# Patient Record
Sex: Female | Born: 1970 | Race: White | Hispanic: No | Marital: Married | Smoking: Never smoker
Health system: Southern US, Community
[De-identification: ages and names within clinical notes are randomized; demographics above are authoritative.]

## PROBLEM LIST (undated history)

## (undated) DIAGNOSIS — D649 Anemia, unspecified: Secondary | ICD-10-CM

## (undated) DIAGNOSIS — J302 Other seasonal allergic rhinitis: Secondary | ICD-10-CM

## (undated) HISTORY — PX: WISDOM TOOTH EXTRACTION: SHX21

## (undated) HISTORY — PX: APPENDECTOMY: SHX54

---

## 2017-02-27 ENCOUNTER — Other Ambulatory Visit: Payer: Self-pay | Admitting: Obstetrics & Gynecology

## 2017-02-27 DIAGNOSIS — R928 Other abnormal and inconclusive findings on diagnostic imaging of breast: Secondary | ICD-10-CM

## 2017-03-04 ENCOUNTER — Ambulatory Visit
Admission: RE | Admit: 2017-03-04 | Discharge: 2017-03-04 | Disposition: A | Discharge status: Home / Self Care | Payer: 59 | Source: Ambulatory Visit | Attending: Obstetrics & Gynecology | Admitting: Obstetrics & Gynecology

## 2017-03-04 ENCOUNTER — Encounter: Payer: Self-pay | Admitting: Radiology

## 2017-03-04 DIAGNOSIS — R928 Other abnormal and inconclusive findings on diagnostic imaging of breast: Secondary | ICD-10-CM

## 2017-03-11 ENCOUNTER — Encounter (HOSPITAL_COMMUNITY): Payer: Self-pay | Admitting: *Deleted

## 2017-03-19 NOTE — H&P (Signed)
Abigail CopasJennifer Ware is an 46 y.o. female with heavy menstrual bleeding.  She declines hormonal treatment as well as hysterectomy.    Pertinent Gynecological History: Menses: flow is excessive with use of 8 pads or tampons on heaviest days Bleeding: regular Contraception: tubal ligation DES exposure: unknown Blood transfusions: none Sexually transmitted diseases: no past history Previous GYN Procedures: none  Last mammogram: normal Date: 2018 Last pap: normal Date: 2017 OB History: G2, P2   Menstrual History: Menarche age: n/a Patient's last menstrual period was 03/04/2017 (exact date).    Past Medical History:  Diagnosis Date  . Anemia    history  . Seasonal allergies   . SVD (spontaneous vaginal delivery)    x 2    Past Surgical History:  Procedure Laterality Date  . APPENDECTOMY    . WISDOM TOOTH EXTRACTION      Family History  Problem Relation Age of Onset  . Breast cancer Mother     Social History:  reports that she has never smoked. She has never used smokeless tobacco. She reports that she drinks about 1.8 oz of alcohol per week . She reports that she does not use drugs.  Allergies:  Allergies  Allergen Reactions  . Sulfa Antibiotics Rash    As a child    No prescriptions prior to admission.    ROS  Height 5\' 6"  (1.676 m), weight 168 lb (76.2 kg), last menstrual period 03/04/2017. Physical Exam  Constitutional: She is oriented to person, place, and time. She appears well-developed and well-nourished.  GI: Soft. There is no rebound and no guarding.  Neurological: She is alert and oriented to person, place, and time.  Skin: Skin is warm and dry.  Psychiatric: She has a normal mood and affect. Her behavior is normal.    No results found for this or any previous visit (from the past 24 hour(s)).  No results found.  Assessment/Plan: 46yo G2P2 with heavy menstrual bleeding -H/S, D&C, endometrial ablation -Patient has been counseled re: risk of bleeding,  infection, scarring, and damage to surrounding structures.  She understands that since she has had BTL, she is at risk of developing post-ablation pain syndrome.  Additionally, the uterine cavity cannot be reliably accessed should she have irregular bleeding in the futures.  She is informed that up to 50% of patients receiving ablation will require hysterectomy.  All questions were answered and the patient wishes to proceed.  Keller Bounds 03/19/2017, 9:16 PM

## 2017-03-25 ENCOUNTER — Ambulatory Visit (HOSPITAL_COMMUNITY): Payer: 59 | Admitting: Registered Nurse

## 2017-03-25 ENCOUNTER — Ambulatory Visit (HOSPITAL_COMMUNITY)
Admission: RE | Admit: 2017-03-25 | Discharge: 2017-03-25 | Disposition: A | Discharge status: Home / Self Care | Payer: 59 | Source: Ambulatory Visit | Attending: Obstetrics & Gynecology | Admitting: Obstetrics & Gynecology

## 2017-03-25 ENCOUNTER — Encounter (HOSPITAL_COMMUNITY)
Admission: RE | Disposition: A | Discharge status: Home / Self Care | Payer: Self-pay | Source: Ambulatory Visit | Attending: Obstetrics & Gynecology

## 2017-03-25 ENCOUNTER — Encounter (HOSPITAL_COMMUNITY): Payer: Self-pay

## 2017-03-25 DIAGNOSIS — Z79899 Other long term (current) drug therapy: Secondary | ICD-10-CM | POA: Diagnosis not present

## 2017-03-25 DIAGNOSIS — N920 Excessive and frequent menstruation with regular cycle: Secondary | ICD-10-CM | POA: Insufficient documentation

## 2017-03-25 HISTORY — DX: Anemia, unspecified: D64.9

## 2017-03-25 HISTORY — DX: Other seasonal allergic rhinitis: J30.2

## 2017-03-25 HISTORY — PX: DILITATION & CURRETTAGE/HYSTROSCOPY WITH NOVASURE ABLATION: SHX5568

## 2017-03-25 LAB — TYPE AND SCREEN
ABO/RH(D): O POS
Antibody Screen: NEGATIVE

## 2017-03-25 LAB — CBC
HEMATOCRIT: 40.6 % (ref 36.0–46.0)
Hemoglobin: 14.4 g/dL (ref 12.0–15.0)
MCH: 32.6 pg (ref 26.0–34.0)
MCHC: 35.5 g/dL (ref 30.0–36.0)
MCV: 91.9 fL (ref 78.0–100.0)
Platelets: 242 10*3/uL (ref 150–400)
RBC: 4.42 MIL/uL (ref 3.87–5.11)
RDW: 13.3 % (ref 11.5–15.5)
WBC: 4.9 10*3/uL (ref 4.0–10.5)

## 2017-03-25 LAB — ABO/RH: ABO/RH(D): O POS

## 2017-03-25 LAB — PREGNANCY, URINE: Preg Test, Ur: NEGATIVE

## 2017-03-25 SURGERY — DILATATION & CURETTAGE/HYSTEROSCOPY WITH NOVASURE ABLATION
Anesthesia: General | Site: Vagina

## 2017-03-25 MED ORDER — OXYCODONE-ACETAMINOPHEN 5-325 MG PO TABS
1.0000 | ORAL_TABLET | ORAL | Status: DC | PRN
  Administered 2017-03-25: 1 via ORAL

## 2017-03-25 MED ORDER — KETOROLAC TROMETHAMINE 30 MG/ML IJ SOLN: 30.0000 mg | Freq: Once | INTRAMUSCULAR | Status: DC | PRN

## 2017-03-25 MED ORDER — ONDANSETRON HCL 4 MG/2ML IJ SOLN
INTRAMUSCULAR | Status: AC
  Filled 2017-03-25: qty 2

## 2017-03-25 MED ORDER — ACETAMINOPHEN 160 MG/5ML PO SOLN: 325.0000 mg | ORAL | Status: DC | PRN

## 2017-03-25 MED ORDER — SCOPOLAMINE 1 MG/3DAYS TD PT72
1.0000 | MEDICATED_PATCH | Freq: Once | TRANSDERMAL | Status: DC
  Administered 2017-03-25: 1.5 mg via TRANSDERMAL

## 2017-03-25 MED ORDER — OXYCODONE-ACETAMINOPHEN 5-325 MG PO TABS: 1.0000 | ORAL_TABLET | Freq: Four times a day (QID) | ORAL | 0 refills | Status: DC | PRN

## 2017-03-25 MED ORDER — LIDOCAINE HCL (CARDIAC) 20 MG/ML IV SOLN
INTRAVENOUS | Status: AC
  Filled 2017-03-25: qty 5

## 2017-03-25 MED ORDER — KETOROLAC TROMETHAMINE 30 MG/ML IJ SOLN
INTRAMUSCULAR | Status: AC
  Filled 2017-03-25: qty 1

## 2017-03-25 MED ORDER — MEPERIDINE HCL 25 MG/ML IJ SOLN: 6.2500 mg | INTRAMUSCULAR | Status: DC | PRN

## 2017-03-25 MED ORDER — ACETAMINOPHEN 325 MG PO TABS: 325.0000 mg | ORAL_TABLET | ORAL | Status: DC | PRN

## 2017-03-25 MED ORDER — PROPOFOL 10 MG/ML IV BOLUS
INTRAVENOUS | Status: AC
  Filled 2017-03-25: qty 20

## 2017-03-25 MED ORDER — LIDOCAINE 2% (20 MG/ML) 5 ML SYRINGE
INTRAMUSCULAR | Status: DC | PRN
  Administered 2017-03-25: 50 mg via INTRAVENOUS

## 2017-03-25 MED ORDER — DEXAMETHASONE SODIUM PHOSPHATE 10 MG/ML IJ SOLN
INTRAMUSCULAR | Status: DC | PRN
  Administered 2017-03-25: 10 mg via INTRAVENOUS

## 2017-03-25 MED ORDER — MIDAZOLAM HCL 2 MG/2ML IJ SOLN
INTRAMUSCULAR | Status: AC
  Filled 2017-03-25: qty 2

## 2017-03-25 MED ORDER — DEXAMETHASONE SODIUM PHOSPHATE 10 MG/ML IJ SOLN
INTRAMUSCULAR | Status: AC
  Filled 2017-03-25: qty 1

## 2017-03-25 MED ORDER — OXYCODONE-ACETAMINOPHEN 5-325 MG PO TABS
ORAL_TABLET | ORAL | Status: AC
  Filled 2017-03-25: qty 1

## 2017-03-25 MED ORDER — FENTANYL CITRATE (PF) 100 MCG/2ML IJ SOLN
INTRAMUSCULAR | Status: AC
  Filled 2017-03-25: qty 2

## 2017-03-25 MED ORDER — ONDANSETRON HCL 4 MG/2ML IJ SOLN
INTRAMUSCULAR | Status: DC | PRN
  Administered 2017-03-25: 4 mg via INTRAVENOUS

## 2017-03-25 MED ORDER — CEFAZOLIN SODIUM-DEXTROSE 2-4 GM/100ML-% IV SOLN
2.0000 g | INTRAVENOUS | Status: AC
  Administered 2017-03-25: 2 g via INTRAVENOUS

## 2017-03-25 MED ORDER — ONDANSETRON HCL 4 MG/2ML IJ SOLN: 4.0000 mg | Freq: Once | INTRAMUSCULAR | Status: DC | PRN

## 2017-03-25 MED ORDER — KETOROLAC TROMETHAMINE 30 MG/ML IJ SOLN
INTRAMUSCULAR | Status: DC | PRN
  Administered 2017-03-25: 30 mg via INTRAVENOUS

## 2017-03-25 MED ORDER — LIDOCAINE HCL 1 % IJ SOLN
INTRAMUSCULAR | Status: AC
  Filled 2017-03-25: qty 20

## 2017-03-25 MED ORDER — MIDAZOLAM HCL 5 MG/5ML IJ SOLN
INTRAMUSCULAR | Status: DC | PRN
  Administered 2017-03-25: 2 mg via INTRAVENOUS

## 2017-03-25 MED ORDER — FENTANYL CITRATE (PF) 100 MCG/2ML IJ SOLN: 25.0000 ug | INTRAMUSCULAR | Status: DC | PRN

## 2017-03-25 MED ORDER — PROPOFOL 10 MG/ML IV BOLUS
INTRAVENOUS | Status: DC | PRN
  Administered 2017-03-25: 200 mg via INTRAVENOUS

## 2017-03-25 MED ORDER — FENTANYL CITRATE (PF) 100 MCG/2ML IJ SOLN
INTRAMUSCULAR | Status: DC | PRN
Start: 2017-03-25 — End: 2017-03-25
  Administered 2017-03-25 (×2): 50 ug via INTRAVENOUS

## 2017-03-25 MED ORDER — LACTATED RINGERS IV SOLN
INTRAVENOUS | Status: DC
  Administered 2017-03-25: 125 mL/h via INTRAVENOUS

## 2017-03-25 MED ORDER — IBUPROFEN 800 MG PO TABS: 800.0000 mg | ORAL_TABLET | Freq: Four times a day (QID) | ORAL | 0 refills | Status: AC | PRN

## 2017-03-25 MED ORDER — SODIUM CHLORIDE 0.9 % IR SOLN
Status: DC | PRN
  Administered 2017-03-25: 3000 mL

## 2017-03-25 MED ORDER — SCOPOLAMINE 1 MG/3DAYS TD PT72
MEDICATED_PATCH | TRANSDERMAL | Status: AC
  Administered 2017-03-25: 1.5 mg via TRANSDERMAL
  Filled 2017-03-25: qty 1

## 2017-03-25 SURGICAL SUPPLY — 16 items
ABLATOR ENDOMETRIAL BIPOLAR (ABLATOR) ×3 IMPLANT
CANISTER SUCT 3000ML PPV (MISCELLANEOUS) ×3 IMPLANT
CATH ROBINSON RED A/P 16FR (CATHETERS) ×3 IMPLANT
CLOTH BEACON ORANGE TIMEOUT ST (SAFETY) ×3 IMPLANT
CONTAINER PREFILL 10% NBF 60ML (FORM) IMPLANT
GLOVE BIO SURGEON STRL SZ 6 (GLOVE) ×3 IMPLANT
GLOVE BIOGEL PI IND STRL 6 (GLOVE) ×2 IMPLANT
GLOVE BIOGEL PI IND STRL 7.0 (GLOVE) ×1 IMPLANT
GLOVE BIOGEL PI INDICATOR 6 (GLOVE) ×4
GLOVE BIOGEL PI INDICATOR 7.0 (GLOVE) ×2
GOWN STRL REUS W/TWL LRG LVL3 (GOWN DISPOSABLE) ×6 IMPLANT
PACK VAGINAL MINOR WOMEN LF (CUSTOM PROCEDURE TRAY) ×3 IMPLANT
PAD OB MATERNITY 4.3X12.25 (PERSONAL CARE ITEMS) ×3 IMPLANT
TOWEL OR 17X24 6PK STRL BLUE (TOWEL DISPOSABLE) ×6 IMPLANT
TUBING AQUILEX INFLOW (TUBING) ×3 IMPLANT
TUBING AQUILEX OUTFLOW (TUBING) ×3 IMPLANT

## 2017-03-25 NOTE — Progress Notes (Signed)
No change to H&P.  Abigail Repsher, DO 

## 2017-03-25 NOTE — Anesthesia Procedure Notes (Signed)
Procedure Name: LMA Insertion Date/Time: 03/25/2017 1:00 PM Performed by: Jhonnie Garner Pre-anesthesia Checklist: Patient identified, Emergency Drugs available, Suction available and Patient being monitored Patient Re-evaluated:Patient Re-evaluated prior to inductionOxygen Delivery Method: Circle system utilized Preoxygenation: Pre-oxygenation with 100% oxygen Intubation Type: IV induction Ventilation: Mask ventilation without difficulty LMA: LMA inserted LMA Size: 4.0 Number of attempts: 1 Placement Confirmation: positive ETCO2 and breath sounds checked- equal and bilateral Tube secured with: Tape Dental Injury: Teeth and Oropharynx as per pre-operative assessment

## 2017-03-25 NOTE — Transfer of Care (Signed)
Immediate Anesthesia Transfer of Care Note  Patient: Abigail Ware  Procedure(s) Performed: Procedure(s): DILATATION & CURETTAGE/HYSTEROSCOPY WITH NOVASURE ABLATION (N/A)  Patient Location: PACU  Anesthesia Type:General  Level of Consciousness:  sedated, patient cooperative and responds to stimulation  Airway & Oxygen Therapy:Patient Spontanous Breathing and Patient connected to face mask oxgen  Post-op Assessment:  Report given to PACU RN and Post -op Vital signs reviewed and stable  Post vital signs:  Reviewed and stable  Last Vitals:  Vitals:   03/25/17 1206  BP: 130/79  Pulse: 79  Resp: 16  Temp: 37.2 C    Complications: No apparent anesthesia complications

## 2017-03-25 NOTE — Op Note (Signed)
PREOPERATIVE DIAGNOSIS:  46 y.o. with heavy menstrual bleeding  POSTOPERATIVE DIAGNOSIS: The same  PROCEDURE: Hysteroscopy, Dilation and Curettage, NovaSure.  SURGEON:  Dr. Mitchel Honour  INDICATIONS: 46 y.o. here for scheduled surgery for treatment of heavy menstrual bleeding.   Risks of surgery were discussed with the patient including but not limited to: bleeding which may require transfusion; infection which may require antibiotics; injury to uterus or surrounding organs; intrauterine scarring which may impair future fertility; need for additional procedures including laparotomy or laparoscopy; and other postoperative/anesthesia complications. Written informed consent was obtained.    FINDINGS:  An 8 week size uterus.  Diffuse proliferative endometrium.  Normal ostia bilaterally.  ANESTHESIA:   General  ESTIMATED BLOOD LOSS:  Less than 20 ml  SPECIMENS: Endometrial curettings sent to pathology  COMPLICATIONS:  None immediate.  PROCEDURE DETAILS:  The patient received intravenous antibiotics while in the preoperative area.  She was then taken to the operating room where general anesthesia was administered and was found to be adequate.  After an adequate timeout was performed, she was placed in the dorsal lithotomy position and examined; then prepped and draped in the sterile manner.   Her bladder was catheterized for an unmeasured amount of clear, yellow urine. A speculum was then placed in the patient's vagina and a single tooth tenaculum was applied to the anterior lip of the cervix.   The uteus was sounded to 10 cm and dilated manually with metal dilators to accommodate the 5.5 mm diagnostic hysteroscope.  Once the cervix was dilated, the hysteroscope was inserted under direct visualization using saline as a suspension medium.  The uterine cavity was carefully examined, both ostia were recognized.   After further careful visualization of the uterine cavity, the hysteroscope was removed under  direct visualization; measurement of cervical length of 4.5 cm was noted on removal.  A sharp curettage was then performed to obtain a moderate amount of endometrial curettings.  The NovaSure was opened and placed in the cavity per manufacturer's protocol.  The device was seated and cavity width found to be 4.4 cm.  The cavity assessment was performed and then the treatment cycle which lasted 1 minute 35 seconds.  The device was removed.  The tenaculum was removed from the anterior lip of the cervix and the vaginal speculum was removed after noting good hemostasis.  The patient tolerated the procedure well and was taken to the recovery area awake, extubated and in stable condition.

## 2017-03-25 NOTE — Anesthesia Preprocedure Evaluation (Addendum)
Anesthesia Evaluation  Patient identified by MRN, date of birth, ID band Patient awake    Reviewed: Allergy & Precautions, H&P , NPO status , Patient's Chart, lab work & pertinent test results  Airway Mallampati: I  TM Distance: >3 FB Neck ROM: full    Dental no notable dental hx. (+) Teeth Intact   Pulmonary neg pulmonary ROS,    Pulmonary exam normal        Cardiovascular negative cardio ROS Normal cardiovascular exam     Neuro/Psych negative neurological ROS  negative psych ROS   GI/Hepatic negative GI ROS, Neg liver ROS,   Endo/Other  negative endocrine ROS  Renal/GU negative Renal ROS     Musculoskeletal negative musculoskeletal ROS (+)   Abdominal Normal abdominal exam  (+)   Peds  Hematology   Anesthesia Other Findings   Reproductive/Obstetrics negative OB ROS                            Anesthesia Physical Anesthesia Plan  ASA: I  Anesthesia Plan: General   Post-op Pain Management:    Induction: Intravenous  Airway Management Planned: LMA  Additional Equipment:   Intra-op Plan:   Post-operative Plan:   Informed Consent: I have reviewed the patients History and Physical, chart, labs and discussed the procedure including the risks, benefits and alternatives for the proposed anesthesia with the patient or authorized representative who has indicated his/her understanding and acceptance.     Plan Discussed with: CRNA and Surgeon  Anesthesia Plan Comments:         Anesthesia Quick Evaluation

## 2017-03-25 NOTE — Discharge Instructions (Signed)
Call MD for T>100.4, heavy vaginal bleeding, severe abdominal pain, intractable nausea and/or vomiting, or respiratory distress.  Call office to schedule postop appointment in 2 weeks.  No driving while taking narcotics.  Pelvic rest x 4 weeks.    DISCHARGE INSTRUCTIONS: HYSTEROSCOPY / ENDOMETRIAL ABLATION The following instructions have been prepared to help you care for yourself upon your return home.  May Remove Scop patch on or before 03/28/17  May take Ibuprofen after 7:20 pm today.  May take stool softner while taking narcotic pain medication to prevent constipation.  Drink plenty of water.  Personal hygiene:  Use sanitary pads for vaginal drainage, not tampons.  Shower the day after your procedure.  NO tub baths, pools or Jacuzzis for 2-3 weeks.  Wipe front to back after using the bathroom.  Activity and limitations:  Do NOT drive or operate any equipment for 24 hours. The effects of anesthesia are still present and drowsiness may result.  Do NOT rest in bed all day.  Walking is encouraged.  Walk up and down stairs slowly.  You may resume your normal activity in one to two days or as indicated by your physician. Sexual activity: NO intercourse for at least 2 weeks after the procedure, or as indicated by your Doctor.  Diet: Eat a light meal as desired this evening. You may resume your usual diet tomorrow.  Return to Work: You may resume your work activities in one to two days or as indicated by Therapist, sports.  What to expect after your surgery: Expect to have vaginal bleeding/discharge for 2-3 days and spotting for up to 10 days. It is not unusual to have soreness for up to 1-2 weeks. You may have a slight burning sensation when you urinate for the first day. Mild cramps may continue for a couple of days. You may have a regular period in 2-6 weeks.  Call your doctor for any of the following:  Excessive vaginal bleeding or clotting, saturating and changing one pad  every hour.  Inability to urinate 6 hours after discharge from hospital.  Pain not relieved by pain medication.  Fever of 100.4 F or greater.  Unusual vaginal discharge or odor.  Return to office _________________Call for an appointment ___________________ Patients signature: ______________________ Nurses signature ________________________  Post Anesthesia Care Unit [phone]     Post Anesthesia Home Care Instructions  Activity: Get plenty of rest for the remainder of the day. A responsible individual must stay with you for 24 hours following the procedure.  For the next 24 hours, DO NOT: -Drive a car -Advertising copywriter -Drink alcoholic beverages -Take any medication unless instructed by your physician -Make any legal decisions or sign important papers.  Meals: Start with liquid foods such as gelatin or soup. Progress to regular foods as tolerated. Avoid greasy, spicy, heavy foods. If nausea and/or vomiting occur, drink only clear liquids until the nausea and/or vomiting subsides. Call your physician if vomiting continues.  Special Instructions/Symptoms: Your throat may feel dry or sore from the anesthesia or the breathing tube placed in your throat during surgery. If this causes discomfort, gargle with warm salt water. The discomfort should disappear within 24 hours.  If you had a scopolamine patch placed behind your ear for the management of post- operative nausea and/or vomiting:  1. The medication in the patch is effective for 72 hours, after which it should be removed.  Wrap patch in a tissue and discard in the trash. Wash hands thoroughly with soap and water.  2. You may remove the patch earlier than 72 hours if you experience unpleasant side effects which may include dry mouth, dizziness or visual disturbances. °3. Avoid touching the patch. Wash your hands with soap and water after contact with the patch. °  ° °

## 2017-03-25 NOTE — Anesthesia Postprocedure Evaluation (Addendum)
Anesthesia Post Note  Patient: Abigail Ware  Procedure(s) Performed: Procedure(s) (LRB): DILATATION & CURETTAGE/HYSTEROSCOPY WITH NOVASURE ABLATION (N/A)  Patient location during evaluation: PACU Anesthesia Type: General Level of consciousness: awake Pain management: pain level controlled Vital Signs Assessment: post-procedure vital signs reviewed and stable Respiratory status: spontaneous breathing Cardiovascular status: stable Postop Assessment: no signs of nausea or vomiting Anesthetic complications: no        Last Vitals:  Vitals:   03/25/17 1206 03/25/17 1334  BP: 130/79 110/67  Pulse: 79 78  Resp: 16 16  Temp: 37.2 C 37.1 C    Last Pain:  Vitals:   03/25/17 1206  TempSrc: Oral   Pain Goal: Patients Stated Pain Goal: 4 (03/25/17 1206)               Veleka Djordjevic JR,JOHN Susann Givens

## 2017-03-26 ENCOUNTER — Encounter (HOSPITAL_COMMUNITY): Payer: Self-pay | Admitting: Obstetrics & Gynecology

## 2017-05-29 NOTE — Addendum Note (Signed)
Addendum  created 05/29/17 1137 by Antwonette Feliz, MD   Sign clinical note    

## 2017-07-09 ENCOUNTER — Ambulatory Visit (HOSPITAL_COMMUNITY)
Admission: EM | Admit: 2017-07-09 | Discharge: 2017-07-09 | Disposition: A | Discharge status: Home / Self Care | Payer: 59 | Attending: Family Medicine | Admitting: Family Medicine

## 2017-07-09 ENCOUNTER — Encounter (HOSPITAL_COMMUNITY): Payer: Self-pay

## 2017-07-09 DIAGNOSIS — N390 Urinary tract infection, site not specified: Secondary | ICD-10-CM

## 2017-07-09 DIAGNOSIS — R319 Hematuria, unspecified: Secondary | ICD-10-CM

## 2017-07-09 LAB — POCT URINALYSIS DIP (DEVICE)
Bilirubin Urine: NEGATIVE
Glucose, UA: NEGATIVE mg/dL
Ketones, ur: NEGATIVE mg/dL
NITRITE: NEGATIVE
PH: 6 (ref 5.0–8.0)
Protein, ur: 100 mg/dL — AB
Specific Gravity, Urine: >=1.03 (ref 1.005–1.030)
UROBILINOGEN UA: 0.2 mg/dL (ref 0.0–1.0)

## 2017-07-09 MED ORDER — CEPHALEXIN 500 MG PO CAPS: 500.0000 mg | ORAL_CAPSULE | Freq: Two times a day (BID) | ORAL | 0 refills | Status: DC

## 2017-07-09 NOTE — ED Triage Notes (Signed)
Pt noticed on Monday she has a uti. Frequency, dysuria and now having hematuria. No fever or otc meds tried.

## 2017-07-10 NOTE — ED Provider Notes (Signed)
  [facility]    ASSESSMENT & PLAN:  Today you were diagnosed with the following: 1. Urinary tract infection with hematuria, site unspecified     You have been prescribed prescription medications this visit.   Meds ordered this encounter  Medications  . cephALEXin (KEFLEX) 500 MG capsule    Sig: Take 1 capsule (500 mg total) by mouth 2 (two) times daily.    Dispense:  14 capsule    Refill:  0    We have send a urine culture and will contact you when the results are available.  You may use over the counter Pyridium if needed for any discomfort associated with urination. Please do your best to ensure adequate fluid intake.  If you are not improving over the next few days or feel you are worsening please follow up here or the Emergency Department if you are unable to see your regular doctor.  Outlined signs and symptoms indicating need for more acute intervention. Call or return to clinic if these symptoms worsen or fail to improve as anticipated. Patient verbalized understanding. After Visit Summary given.  SUBJECTIVE:  Abigail Ware is a 46 y.o. female who complaints of urinary frequency, urgency and dysuria for the past 3 days, without flank pain, fever, chills, abnormal vaginal discharge or bleeding. + hematuria. Normal PO intake. No flank or abdominal pain. No self treatment.  LMP: BP 111/61 (BP Location: Left Arm)   Pulse 60   Temp 98.8 F (37.1 C) (Oral)   Resp 18   LMP 06/25/2017 (Exact Date)   OBJECTIVE:  Vitals:   07/09/17 1720  BP: 111/61  Pulse: 60  Resp: 18  Temp: 98.8 F (37.1 C)  TempSrc: Oral  SpO2: 100%    Appears well, in no apparent distress. Abdomen is soft without tenderness, guarding, mass, rebound or organomegaly. No CVA tenderness or inguinal adenopathy noted.  Labs Reviewed  POCT URINALYSIS DIP (DEVICE) - Abnormal; Notable for the following:       Result Value   Hgb urine dipstick LARGE (*)    Protein, ur 100 (*)    Leukocytes, UA LARGE (*)    All other components within normal limits  URINE CULTURE        Mardella LaymanHagler, Valerio Pinard, MD 07/10/17 1309

## 2017-07-12 LAB — URINE CULTURE: Culture: 80000 — AB

## 2021-03-20 ENCOUNTER — Other Ambulatory Visit: Payer: Self-pay | Admitting: Family

## 2021-03-20 DIAGNOSIS — R9389 Abnormal findings on diagnostic imaging of other specified body structures: Secondary | ICD-10-CM

## 2021-03-21 ENCOUNTER — Other Ambulatory Visit: Payer: Self-pay | Admitting: Family

## 2021-03-25 ENCOUNTER — Ambulatory Visit
Admission: RE | Admit: 2021-03-25 | Discharge: 2021-03-25 | Disposition: A | Discharge status: Home / Self Care | Payer: 59 | Source: Ambulatory Visit | Attending: Family | Admitting: Family

## 2021-03-25 ENCOUNTER — Other Ambulatory Visit: Payer: Self-pay

## 2021-03-25 ENCOUNTER — Other Ambulatory Visit: Payer: Self-pay | Admitting: General Practice

## 2021-03-25 DIAGNOSIS — R9389 Abnormal findings on diagnostic imaging of other specified body structures: Secondary | ICD-10-CM

## 2021-03-25 MED ORDER — GADOBUTROL 1 MMOL/ML IV SOLN
7.0000 mL | Freq: Once | INTRAVENOUS | Status: AC | PRN
  Administered 2021-03-25: 7 mL via INTRAVENOUS

## 2021-05-22 HISTORY — PX: BREAST LUMPECTOMY: SHX2

## 2021-12-30 ENCOUNTER — Encounter: Payer: Self-pay | Admitting: Pulmonary Disease

## 2021-12-30 ENCOUNTER — Other Ambulatory Visit: Payer: Self-pay

## 2021-12-30 ENCOUNTER — Ambulatory Visit (INDEPENDENT_AMBULATORY_CARE_PROVIDER_SITE_OTHER): Payer: 59 | Admitting: Pulmonary Disease

## 2021-12-30 VITALS — BP 112/72 | HR 70 | Temp 98.9°F | Ht 66.0 in | Wt 163.8 lb

## 2021-12-30 DIAGNOSIS — G4719 Other hypersomnia: Secondary | ICD-10-CM | POA: Diagnosis not present

## 2021-12-30 NOTE — Addendum Note (Signed)
Addended by: Arvilla Market on: 12/30/2021 04:09 PM   Modules accepted: Orders

## 2021-12-30 NOTE — Addendum Note (Signed)
Addended by: Arvilla Market on: 12/30/2021 04:13 PM   Modules accepted: Orders

## 2021-12-30 NOTE — Progress Notes (Signed)
Abigail Ware    [id number]    Apr 08, 1971  Primary Care Physician:Pcp, No  Referring Physician: No referring provider defined for this encounter.  Chief complaint:   Patient with daytime fatigue  HPI:  Nonrestorative sleep Sore throat in the mornings Bruxism  Choking episodes, wake up with a panic  Usually goes to bed about 11 PM, final wake up time about 6:30 AM Falls asleep easily Number of awakenings vary significantly  No significant weight gain  Previous sleep study about 8 years ago-was told very mild did not require intervention at the time  More symptoms been reported lately  Gets very fatigued in the late afternoons  Never smoker   Outpatient Encounter Medications as of 12/30/2021  Medication Sig   b complex vitamins tablet Take 1 tablet by mouth daily.   ibuprofen (ADVIL,MOTRIN) 800 MG tablet Take 1 tablet (800 mg total) by mouth every 6 (six) hours as needed.   MAGNESIUM PO Take 1 tablet by mouth daily.   Multiple Vitamin (MULTIVITAMIN WITH MINERALS) TABS tablet Take 1 tablet by mouth daily.   [DISCONTINUED] cephALEXin (KEFLEX) 500 MG capsule Take 1 capsule (500 mg total) by mouth 2 (two) times daily.   [DISCONTINUED] oxyCODONE-acetaminophen (ROXICET) 5-325 MG tablet Take 1-2 tablets by mouth every 6 (six) hours as needed for severe pain.   [DISCONTINUED] phentermine 37.5 MG capsule Take 37.5 mg by mouth every morning.   No facility-administered encounter medications on file as of 12/30/2021.    Allergies as of 12/30/2021 - Review Complete 12/30/2021  Allergen Reaction Noted   Codeine Rash 03/25/2017   Sulfa antibiotics Rash 03/11/2017    Past Medical History:  Diagnosis Date   Anemia    history   Seasonal allergies    SVD (spontaneous vaginal delivery)    x 2    Past Surgical History:  Procedure Laterality Date   APPENDECTOMY     BREAST LUMPECTOMY  05/2021   DILITATION & CURRETTAGE/HYSTROSCOPY WITH NOVASURE ABLATION N/A 03/25/2017    Procedure: DILATATION & CURETTAGE/HYSTEROSCOPY WITH NOVASURE ABLATION;  Surgeon: Mitchel Honour, DO;  Location: [facility];  Service: Gynecology;  Laterality: N/A;   WISDOM TOOTH EXTRACTION      Family History  Problem Relation Age of Onset   Breast cancer Mother    COPD Neg Hx    Lupus Neg Hx     Social History   Socioeconomic History   Marital status: Married    Spouse name: Not on file   Number of children: Not on file   Years of education: Not on file   Highest education level: Not on file  Occupational History   Not on file  Tobacco Use   Smoking status: Never   Smokeless tobacco: Never  Substance and Sexual Activity   Alcohol use: Yes    Alcohol/week: 3.0 standard drinks    Types: 3 Glasses of wine per week    Comment: wine    Drug use: No   Sexual activity: Not on file    Comment: husband - vasectomy  Other Topics Concern   Not on file  Social History Narrative   Not on file   Social Determinants of Health   Financial Resource Strain: Not on file  Food Insecurity: Not on file  Transportation Needs: Not on file  Physical Activity: Not on file  Stress: Not on file  Social Connections: Not on file  Intimate Partner Violence: Not on file    Review  of Systems  Constitutional:  Positive for fatigue.  Respiratory:  Negative for shortness of breath.   Psychiatric/Behavioral:  Positive for sleep disturbance.    Vitals:   12/30/21 1533  BP: 112/72  Pulse: 70  Temp: 98.9 F (37.2 C)  SpO2: 95%     Physical Exam Constitutional:      Appearance: Normal appearance.  HENT:     Head: Normocephalic.     Mouth/Throat:     Mouth: Mucous membranes are moist.  Cardiovascular:     Rate and Rhythm: Normal rate and regular rhythm.     Heart sounds: No murmur heard.   No friction rub.  Pulmonary:     Effort: No respiratory distress.     Breath sounds: No stridor. No wheezing or rhonchi.  Musculoskeletal:     Cervical back: No rigidity or tenderness.   Neurological:     Mental Status: She is alert.  Psychiatric:        Mood and Affect: Mood normal.   Results of the Epworth flowsheet 12/30/2021  Sitting and reading 2  Watching TV 2  Sitting, inactive in a public place (e.g. a theatre or a meeting) 1  As a passenger in a car for an hour without a break 3  Lying down to rest in the afternoon when circumstances permit 3  Sitting and talking to someone 0  Sitting quietly after a lunch without alcohol 3  In a car, while stopped for a few minutes in traffic 0  Total score 14     Data Reviewed: Previous study not available but by patient report was mild  Assessment:  Excessive daytime sleepiness  Daytime fatigue  History of bruxism  Moderate probability of significant obstructive sleep apnea  Pathophysiology of sleep disordered breathing discussed Treatment options for sleep disordered breathing discussed  Plan/Recommendations: Schedule patient for an in lab sleep study  In lab study more appropriate with a history of nonrestorative sleep, bruxism  Trial with melatonin-10 mg at night  Follow-up with 3 to 4 months  She will be more inclined towards an oral device rather than CPAP therapy for sleep apnea if found to be positive   Virl Diamond MD [facility] 12/30/2021, 4:02 PM  CC: No ref. provider found

## 2021-12-30 NOTE — Patient Instructions (Signed)
We will schedule you for an in lab sleep study  -History of mild obstructive sleep apnea -Significant symptoms suggesting sleep apnea -History of bruxism  Tentative follow-up in 3 to 4 months  Melatonin may help-try about 10 mg nightly  Call with significant concerns

## 2022-01-27 DIAGNOSIS — G4710 Hypersomnia, unspecified: Secondary | ICD-10-CM | POA: Diagnosis not present

## 2022-01-29 ENCOUNTER — Ambulatory Visit (HOSPITAL_BASED_OUTPATIENT_CLINIC_OR_DEPARTMENT_OTHER): Payer: 59 | Attending: Pulmonary Disease | Admitting: Pulmonary Disease

## 2022-01-29 ENCOUNTER — Other Ambulatory Visit: Payer: Self-pay

## 2022-01-29 DIAGNOSIS — G4719 Other hypersomnia: Secondary | ICD-10-CM | POA: Insufficient documentation

## 2022-01-29 DIAGNOSIS — G4733 Obstructive sleep apnea (adult) (pediatric): Secondary | ICD-10-CM | POA: Insufficient documentation

## 2022-01-29 DIAGNOSIS — R0683 Snoring: Secondary | ICD-10-CM | POA: Diagnosis not present

## 2022-02-04 ENCOUNTER — Telehealth: Payer: Self-pay | Admitting: Pulmonary Disease

## 2022-02-04 NOTE — Telephone Encounter (Signed)
Call patient  Sleep study result  Date of study: 01/29/2022  Impression: Excessive daytime sleepiness Study was negative for significant sleep apnea  Recommendation: Follow-up as previously scheduled  Optimize sleep hygiene, try and obtain at least 6 to 8 hours of sleep nightly  Follow-up for further discussions regarding options of treatment

## 2022-02-04 NOTE — Procedures (Signed)
POLYSOMNOGRAPHY  Last, First: Abigail, Ware MRN: [id number] Gender: Female Age (years): 50 Weight (lbs): 160 DOB: [birth year] BMI: 26 Primary Care: No PCP Epworth Score: 16 Referring: Tomma Lightning MD Technician: Lowry Ram Interpreting: Tomma Lightning MD Study Type: NPSG Ordered Study Type: NPSG Study date: 01/29/2022 Location: [place] CLINICAL INFORMATION Abigail Ware is a 51 year old Female and was referred to the sleep center for evaluation of G47.80 Other Sleep Disorders. Indications include Daytime Fatigue, Snoring.  MEDICATIONS Patient self administered medications include: N/A. Medications administered during study include No sleep medicine administered.  SLEEP STUDY TECHNIQUE A multi-channel overnight Polysomnography study was performed. The channels recorded and monitored were central and occipital EEG, electrooculogram (EOG), submentalis EMG (chin), nasal and oral airflow, thoracic and abdominal wall motion, anterior tibialis EMG, snore microphone, electrocardiogram, and a pulse oximetry. TECHNICIAN COMMENTS Comments added by Technician: None Comments added by Scorer: N/A SLEEP ARCHITECTURE The study was initiated at 9:59:40 PM and terminated at 5:02:04 AM. The total recorded time was 422.4 minutes. EEG confirmed total sleep time was 401.5 minutes yielding a sleep efficiency of 95.1%%. Sleep onset after lights out was 4.3 minutes with a REM latency of 48.5 minutes. The patient spent 2.6%% of the night in stage N1 sleep, 54.8%% in stage N2 sleep, 0.0%% in stage N3 and 42.6% in REM. Wake after sleep onset (WASO) was 16.6 minutes. The Arousal Index was 20.2/hour. RESPIRATORY PARAMETERS There were a total of 13 respiratory disturbances out of which 0 were apneas ( 0 obstructive, 0 mixed, 0 central) and 13 hypopneas. The apnea/hypopnea index (AHI) was 1.9 events/hour. The central sleep apnea index was 0 events/hour. The REM AHI was 3.5 events/hour and NREM AHI  was 0.8 events/hour. The supine AHI was 1.8 events/hour and the non supine AHI was 2.1 supine during 64.67% of sleep. Respiratory disturbances were associated with oxygen desaturation down to a nadir of 89.0% during sleep. The mean oxygen saturation during the study was 94.6%. The cumulative time under 88% oxygen saturation was 5.5 minutes.  LEG MOVEMENT DATA The total leg movements were 105 with a resulting leg movement index of 15.7/hr .Associated arousal with leg movement index was 2.8/hr.  CARDIAC DATA The underlying cardiac rhythm was most consistent with sinus rhythm. Mean heart rate during sleep was 65.6 bpm. Additional rhythm abnormalities include None.  IMPRESSIONS - No Significant Obstructive Sleep apnea(OSA) - EKG showed no cardiac abnormalities. - No significant Oxygen Desaturation - The patient snored with moderate snoring volume. - No significant periodic leg movements(PLMs) during sleep. However, no significant associated arousals.  DIAGNOSIS - Negative study for significant sleep disordered breathing. - Excessive daytime sleepiness.  RECOMMENDATIONS - Avoid alcohol, sedatives and other CNS depressants that may worsen sleep apnea and disrupt normal sleep architecture. - Sleep hygiene should be reviewed to assess factors that may improve sleep quality. - Weight management and regular exercise should be initiated or continued.  [Electronically signed] 02/04/2022 06:33 AM  Virl Diamond MD NPI: [id number]

## 2022-02-05 NOTE — Telephone Encounter (Signed)
Patient is returning phone call. Patient phone number is [phone]. Available after 5:00 pm.

## 2022-02-05 NOTE — Telephone Encounter (Signed)
I called and left a message for this patient to call back.

## 2022-02-06 NOTE — Telephone Encounter (Signed)
Called and spoke with patient regarding sleep study results. She wanted a follow up to discuss results about sleep study because she is having issues with sleep. Got her scheduled with Dr Val Eagle 03/20/22 at 4:15pm.  Nothing further needed

## 2022-03-20 ENCOUNTER — Ambulatory Visit: Payer: 59 | Admitting: Pulmonary Disease

## 2022-03-24 ENCOUNTER — Other Ambulatory Visit: Payer: Self-pay | Admitting: Surgery

## 2022-03-24 DIAGNOSIS — R9389 Abnormal findings on diagnostic imaging of other specified body structures: Secondary | ICD-10-CM

## 2022-04-07 ENCOUNTER — Ambulatory Visit: Payer: 59 | Admitting: Pulmonary Disease

## 2022-04-07 ENCOUNTER — Encounter: Payer: Self-pay | Admitting: Pulmonary Disease

## 2022-04-07 VITALS — BP 108/70 | HR 75 | Temp 98.4°F | Ht 66.0 in | Wt 164.0 lb

## 2022-04-07 DIAGNOSIS — G4719 Other hypersomnia: Secondary | ICD-10-CM

## 2022-04-07 NOTE — Patient Instructions (Signed)
Your sleep study is negative for significant sleep disordered breathing, your oxygen stayed normal ? ?It is possible to have nonrestorative sleep form significant snoring ? ?An oral device may be an option for treatment of snoring ? ?With a completely negative study with excellent sleep efficiency and the fact that you had a study a while back that was also negative ?-It is unlikely that a repeat study will be any different ?-It is an option when one has significant symptoms and the study is negative ? ?-Surgical evaluation for surgical options is also reasonable ? ?I will see you as needed ?

## 2022-04-07 NOTE — Progress Notes (Signed)
? ?      ?Abigail Ware    [id number]    1971/03/13 ? ?Primary Care Physician:Pcp, No ? ?Referring Physician: No referring provider defined for this encounter. ? ?Chief complaint:   ?Patient with daytime fatigue ? ?HPI: ? 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 ?Choking episodes, wake up with a panic-this is continuing ? ?Usually goes to bed about 11 PM, final wake up time about 6:30 AM ?Falls asleep easily ?Number of awakenings vary significantly ? ?No significant weight gain ? ?Previous sleep study about 8 years ago-was told very mild did not require intervention at the time ? ?She remains concerned about her symptoms but with a significantly negative sleep study with good sleep efficiency it is unlikely that significant sleep apnea exists ? ?Gets very fatigued in the late afternoons ? ?Never smoker ? ?Outpatient Encounter Medications as of 04/07/2022  ?Medication Sig  ? b complex vitamins tablet Take 1 tablet by mouth daily.  ? ibuprofen (ADVIL,MOTRIN) 800 MG tablet Take 1 tablet (800 mg total) by mouth every 6 (six) hours as needed.  ? MAGNESIUM PO Take 1 tablet by mouth daily.  ? Multiple Vitamin (MULTIVITAMIN WITH MINERALS) TABS tablet Take 1 tablet by mouth daily.  ? [DISCONTINUED] diazepam (VALIUM) 5 MG tablet Take by mouth. (Patient not taking: Reported on 04/07/2022)  ? [DISCONTINUED] predniSONE (DELTASONE) 50 MG tablet Take 1 tablet by mouth 13 hours before scan, then take 1 tablet by mouth 7 hours before scan, then take 1 tablet by mouth 1 hour before scan (with benadryl) (Patient not taking: Reported on 04/07/2022)  ? ?No facility-administered encounter medications on file as of 04/07/2022.  ? ? ?Allergies as of  04/07/2022 - Review Complete 04/07/2022  ?Allergen Reaction Noted  ? Codeine Rash 03/25/2017  ? Gadolinium Itching 03/24/2022  ? Sulfa antibiotics Rash 03/11/2017  ? ? ?Past Medical History:  ?Diagnosis Date  ? Anemia   ? history  ? Seasonal allergies   ? SVD (spontaneous vaginal delivery)   ? x 2  ? ? ?Past Surgical History:  ?Procedure Laterality Date  ? APPENDECTOMY    ? BREAST LUMPECTOMY  05/2021  ? DILITATION & CURRETTAGE/HYSTROSCOPY WITH NOVASURE ABLATION N/A 03/25/2017  ? Procedure: DILATATION & CURETTAGE/HYSTEROSCOPY WITH NOVASURE ABLATION;  Surgeon: Mitchel Honour, DO;  Location: [facility];  Service: Gynecology;  Laterality: N/A;  ? WISDOM TOOTH EXTRACTION    ? ? ?Family History  ?Problem Relation Age of Onset  ? Breast cancer Mother   ? COPD Neg Hx   ? Lupus Neg Hx   ? ? ?Social History  ? ?Socioeconomic History  ? Marital status: Married  ?  Spouse name: Not on file  ? Number of children: Not on file  ? Years of education: Not on file  ? Highest education level: Not on file  ?Occupational History  ? Not on file  ?Tobacco Use  ? Smoking status: Never  ? Smokeless tobacco: Never  ?Substance and Sexual Activity  ? Alcohol use: Yes  ?  Alcohol/week: 3.0 standard drinks  ?  Types: 3 Glasses of wine per week  ?  Comment: wine   ? Drug use: No  ? Sexual activity: Not on file  ?  Comment:  husband - vasectomy  ?Other Topics Concern  ? Not on file  ?Social History Narrative  ? Not on file  ? ?Social Determinants of Health  ? ?Financial Resource Strain: Not on file  ?Food Insecurity: Not on file  ?Transportation Needs: Not on file  ?Physical Activity: Not on file  ?Stress: Not on file  ?Social Connections: Not on file  ?Intimate Partner Violence: Not on file  ? ? ?Review of Systems  ?Constitutional:  Positive for fatigue.  ?Respiratory:  Negative for shortness of breath.   ?Psychiatric/Behavioral:  Positive for sleep disturbance.   ? ?Vitals:  ? 04/07/22 1611  ?BP: 108/70  ?Pulse: 75  ?Temp: 98.4 ?F (36.9 ?C)  ?SpO2: 97%   ? ? ? ?Physical Exam ?Constitutional:   ?   Appearance: Normal appearance.  ?HENT:  ?   Head: Normocephalic.  ?   Mouth/Throat:  ?   Mouth: Mucous membranes are moist.  ?Cardiovascular:  ?   Rate and Rhythm: Normal rate and regular rhythm.  ?   Heart sounds: No murmur heard. ?  No friction rub.  ?Pulmonary:  ?   Effort: No respiratory distress.  ?   Breath sounds: No stridor. No wheezing or rhonchi.  ?Musculoskeletal:  ?   Cervical back: No rigidity or tenderness.  ?Neurological:  ?   Mental Status: She is alert.  ?Psychiatric:     ?   Mood and Affect: Mood normal.  ? ? ?  12/30/2021  ?  3:00 PM  ?Results of the Epworth flowsheet  ?Sitting and reading 2  ?Watching TV 2  ?Sitting, inactive in a public place (e.g. a theatre or a meeting) 1  ?As a passenger in a car for an hour without a break 3  ?Lying down to rest in the afternoon when circumstances permit 3  ?Sitting and talking to someone 0  ?Sitting quietly after a lunch without alcohol 3  ?In a car, while stopped for a few minutes in traffic 0  ?Total score 14  ? ? ?Data Reviewed: ?Sleep study showing negative study for significant obstructive sleep apnea ? ?Assessment:  ?Excessive daytime sleepiness ? ?She does have significant snoring which may be contributing to nonrestorative sleep ? ?Daytime fatigue ? ?History of bruxism ? ?Pathophysiology of sleep disordered breathing discussed ?Treatment options for sleep disordered breathing discussed ? ?Does not have any symptoms suggesting presence of narcolepsy ? ?Plan/Recommendations: ?Patient should be evaluated for an oral device for the treatment of snoring ? ?I did offer a repeat sleep study however with an AHI of 1.9 with very good sleep efficiency is unlikely that significant sleep apnea exists.   ? ?She does have nonrestorative sleep which may be related to significant snoring ? ?Surgical interventions for airway narrowing may also be considered ? ?I will see as needed ? ?Virl Diamond MD ?[facility] ?04/07/2022, 4:36 PM ? ?CC: No ref. provider found ? ? ?

## 2022-04-11 ENCOUNTER — Ambulatory Visit
Admission: RE | Admit: 2022-04-11 | Discharge: 2022-04-11 | Disposition: A | Discharge status: Home / Self Care | Payer: 59 | Source: Ambulatory Visit | Attending: Surgery | Admitting: Surgery

## 2022-04-11 ENCOUNTER — Other Ambulatory Visit (HOSPITAL_COMMUNITY): Payer: Self-pay | Admitting: Diagnostic Radiology

## 2022-04-11 DIAGNOSIS — R9389 Abnormal findings on diagnostic imaging of other specified body structures: Secondary | ICD-10-CM

## 2022-04-11 MED ORDER — GADOBUTROL 1 MMOL/ML IV SOLN
7.0000 mL | Freq: Once | INTRAVENOUS | Status: AC | PRN
  Administered 2022-04-11: 7 mL via INTRAVENOUS

## 2022-06-16 ENCOUNTER — Ambulatory Visit
Admission: EM | Admit: 2022-06-16 | Discharge: 2022-06-16 | Disposition: A | Discharge status: Home / Self Care | Payer: 59 | Attending: Family Medicine | Admitting: Family Medicine

## 2022-06-16 DIAGNOSIS — N3090 Cystitis, unspecified without hematuria: Secondary | ICD-10-CM | POA: Insufficient documentation

## 2022-06-16 LAB — POCT URINALYSIS DIP (MANUAL ENTRY)
Bilirubin, UA: NEGATIVE
Glucose, UA: NEGATIVE mg/dL
Ketones, POC UA: NEGATIVE mg/dL
Nitrite, UA: NEGATIVE
Protein Ur, POC: NEGATIVE mg/dL
Spec Grav, UA: 1.01 (ref 1.010–1.025)
Urobilinogen, UA: 0.2 E.U./dL
pH, UA: 6 (ref 5.0–8.0)

## 2022-06-16 MED ORDER — CIPROFLOXACIN HCL 500 MG PO TABS: 500.0000 mg | ORAL_TABLET | Freq: Two times a day (BID) | ORAL | 0 refills | Status: AC

## 2022-06-17 ENCOUNTER — Telehealth: Payer: Self-pay | Admitting: Urgent Care

## 2022-06-17 ENCOUNTER — Telehealth: Payer: Self-pay

## 2022-06-17 MED ORDER — FLUCONAZOLE 150 MG PO TABS: 150.0000 mg | ORAL_TABLET | ORAL | 0 refills | Status: AC

## 2022-06-17 NOTE — Telephone Encounter (Signed)
Patient verification complete (name and date of birth).  Patient called and made aware that diflucan was sent to pharmacy [facility]) and frequency. All questions answered.

## 2022-06-19 LAB — URINE CULTURE: Culture: >=100000 — AB

## 2023-03-09 IMAGING — MG MM BREAST LOCALIZATION CLIP
4 series · 4 of 12 positions shown · non-contrast
Comparison: Previous exam(s).

CLINICAL DATA: Confirmation of clip placement after MRI guided core
needle biopsy of 2 sites in the RIGHT breast and 1 site in the LEFT
breast.

EXAM:
2D and 3D DIAGNOSTIC BILATERAL MAMMOGRAM POST MRI BIOPSY

[L CC synth-2D]
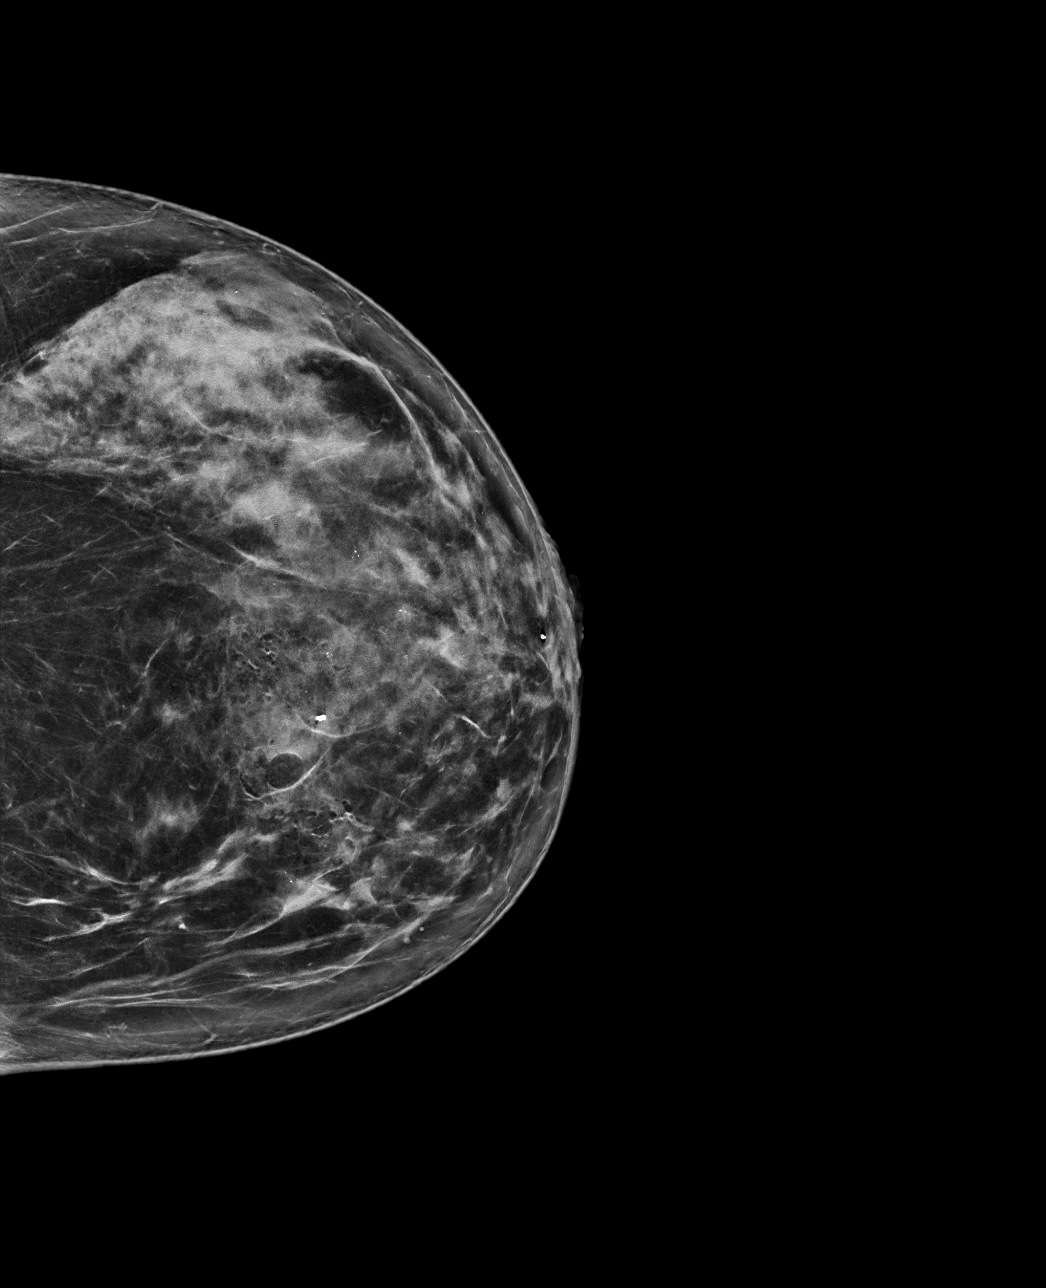

[L ML synth-2D]
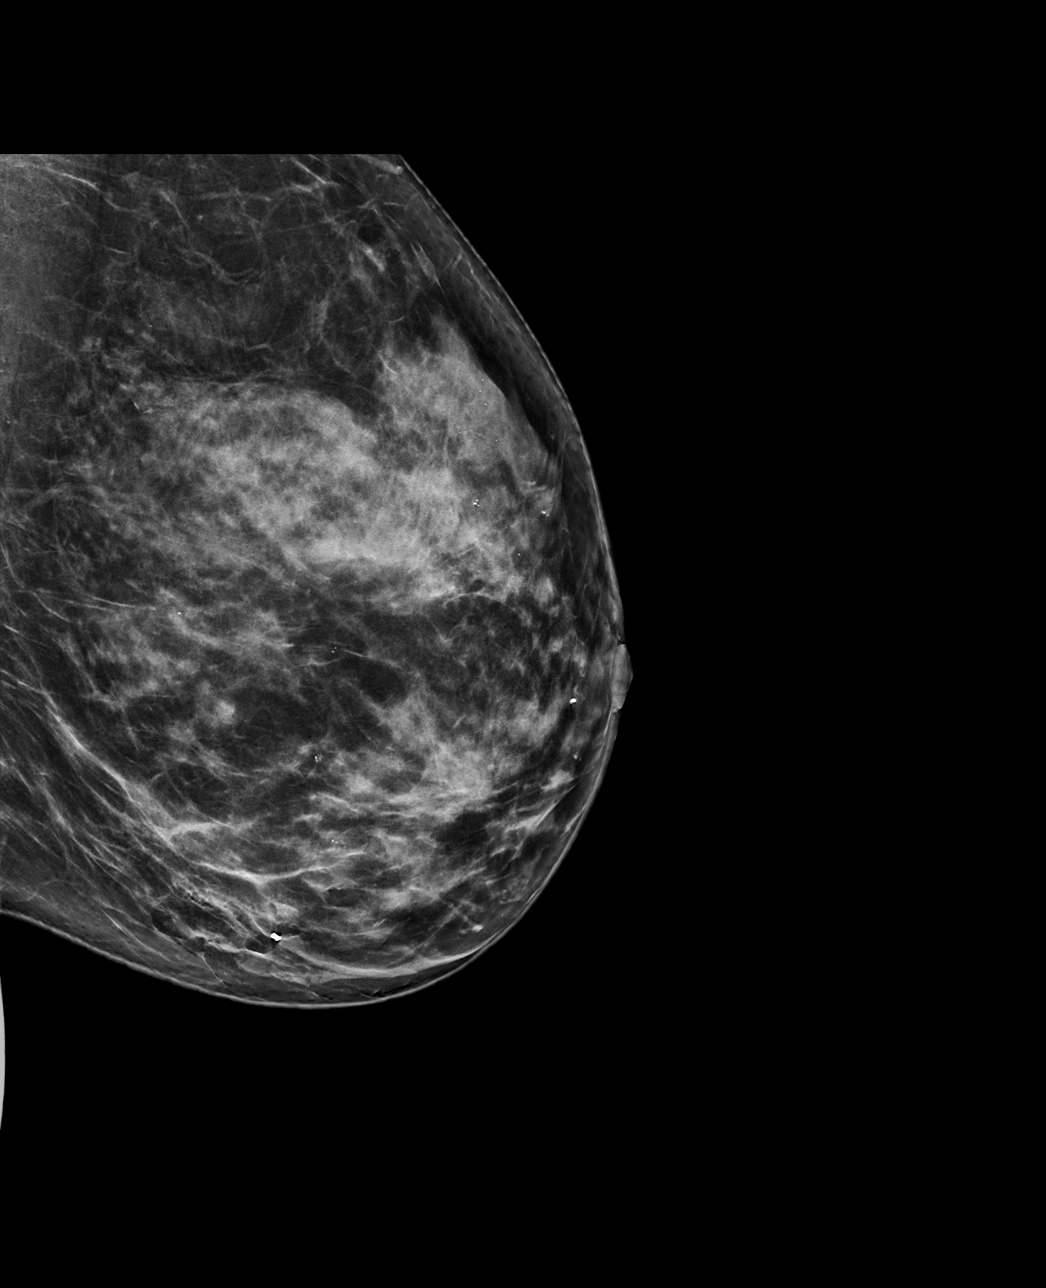

[L ML tomo · tomo slice 37/74.0]
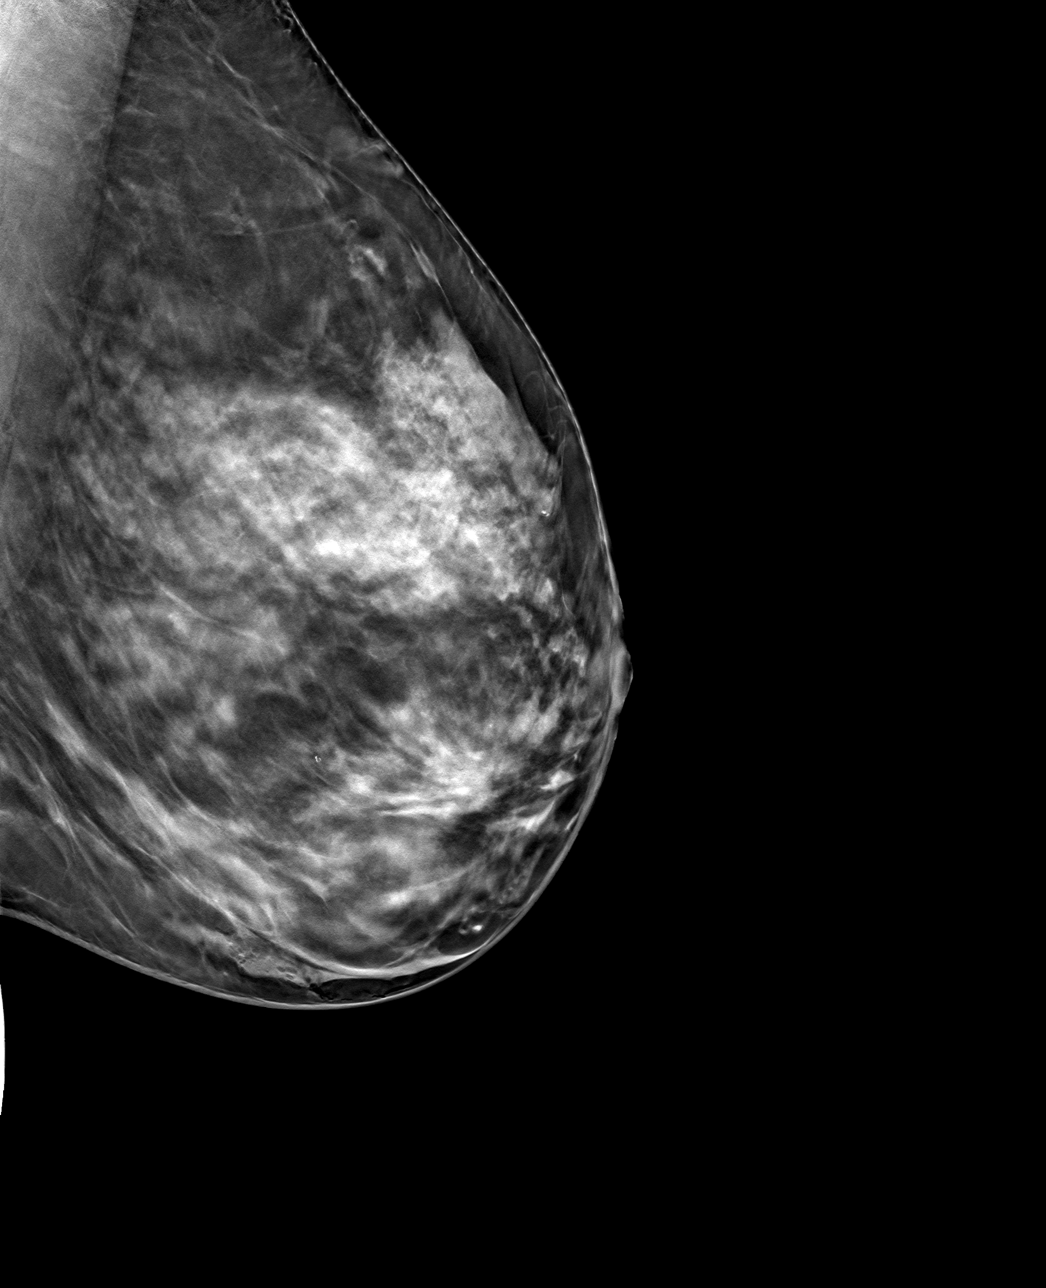

[L CC tomo · tomo slice 40/79.0]
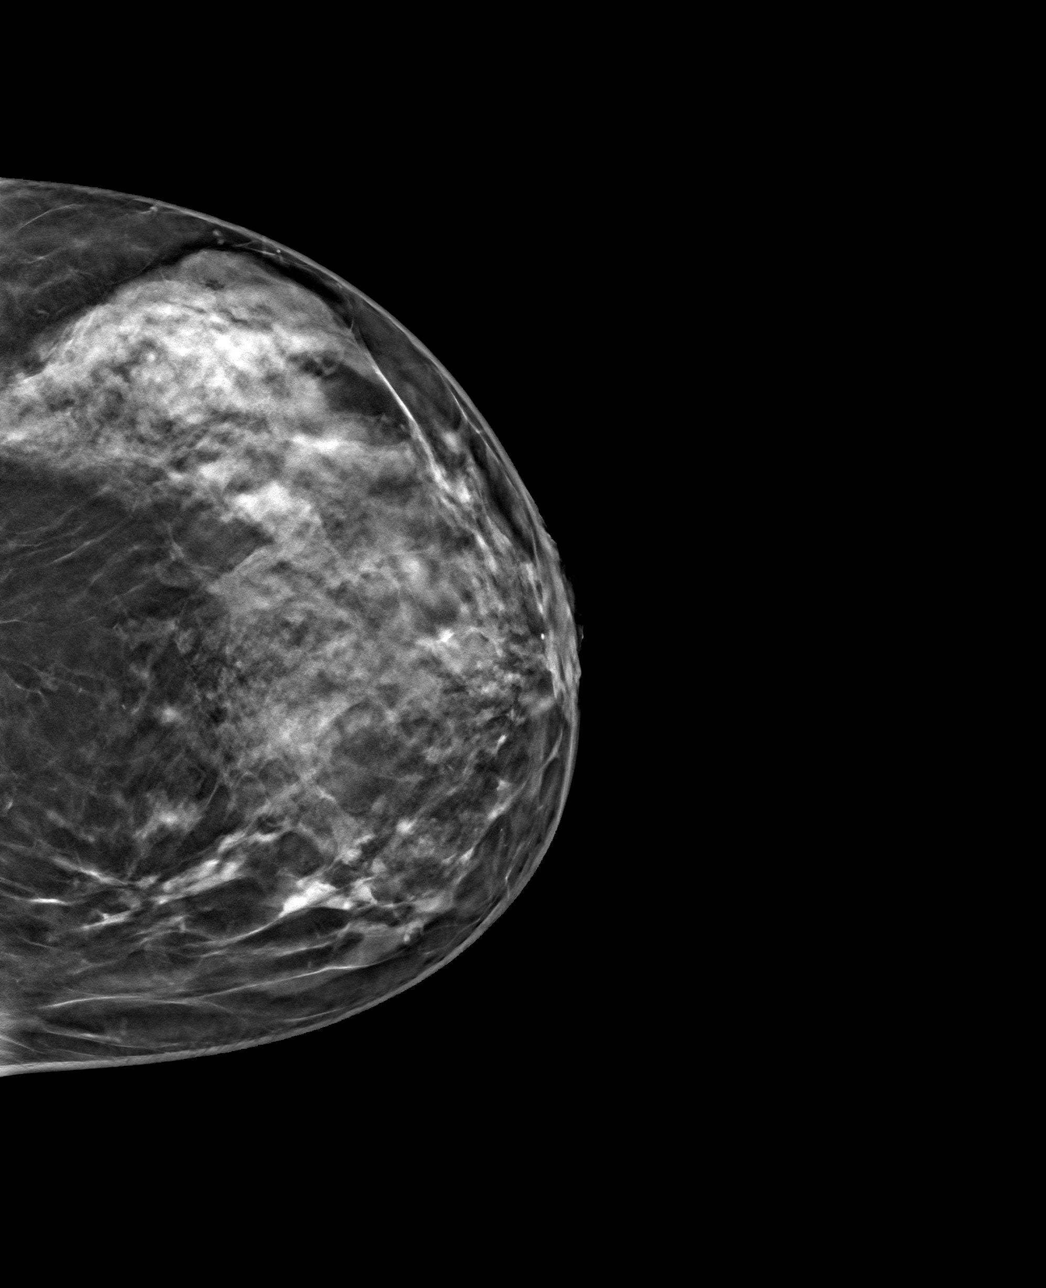

[4 of 12 positions shown; findings below may reference images not displayed]

FINDINGS: 2D and 3D full field CC and mediolateral images were obtained
following MRI guided biopsy of non-mass enhancement in the UPPER
INNER QUADRANT of the RIGHT breast at posterior depth, a mass in the
outer RIGHT breast at anterior depth, and a mass in the LOWER INNER
QUADRANT of the LEFT breast.

RIGHT: The barbell shaped tissue marking clip is appropriately
positioned at the site of the biopsied non-mass enhancement in the
UPPER INNER QUADRANT at posterior depth. The cylinder shaped tissue
marking clip is appropriately positioned at the site of the biopsied
mass in the UPPER INNER QUADRANT at anterior depth.

LEFT: The barbell shaped tissue marking clip is appropriately
positioned at the site of the biopsied mass in the LOWER INNER
QUADRANT at middle depth.

Expected post biopsy changes are present at all 3 sites without
evidence of hematoma.
IMPRESSION: 1. Appropriate positioning of the barbell shaped tissue marking clip
at the site of biopsy in the UPPER INNER QUADRANT of the RIGHT
breast at posterior depth.
2. Appropriate positioning of the cylinder shaped tissue marking
clip at the site of biopsy in the outer RIGHT breast at anterior
depth.
3. Appropriate positioning of the barbell shaped tissue marking clip
at the site of biopsy in the LOWER INNER QUADRANT of the LEFT breast
at middle depth.

Final Assessment: Post Procedure Mammograms for Marker Placement

## 2023-03-09 IMAGING — MG MM BREAST LOCALIZATION CLIP
4 series · 4 of 12 positions shown · non-contrast
Comparison: Previous exam(s).

CLINICAL DATA: Confirmation of clip placement after MRI guided core
needle biopsy of 2 sites in the RIGHT breast and 1 site in the LEFT
breast.

EXAM:
2D and 3D DIAGNOSTIC BILATERAL MAMMOGRAM POST MRI BIOPSY

[R CC synth-2D]
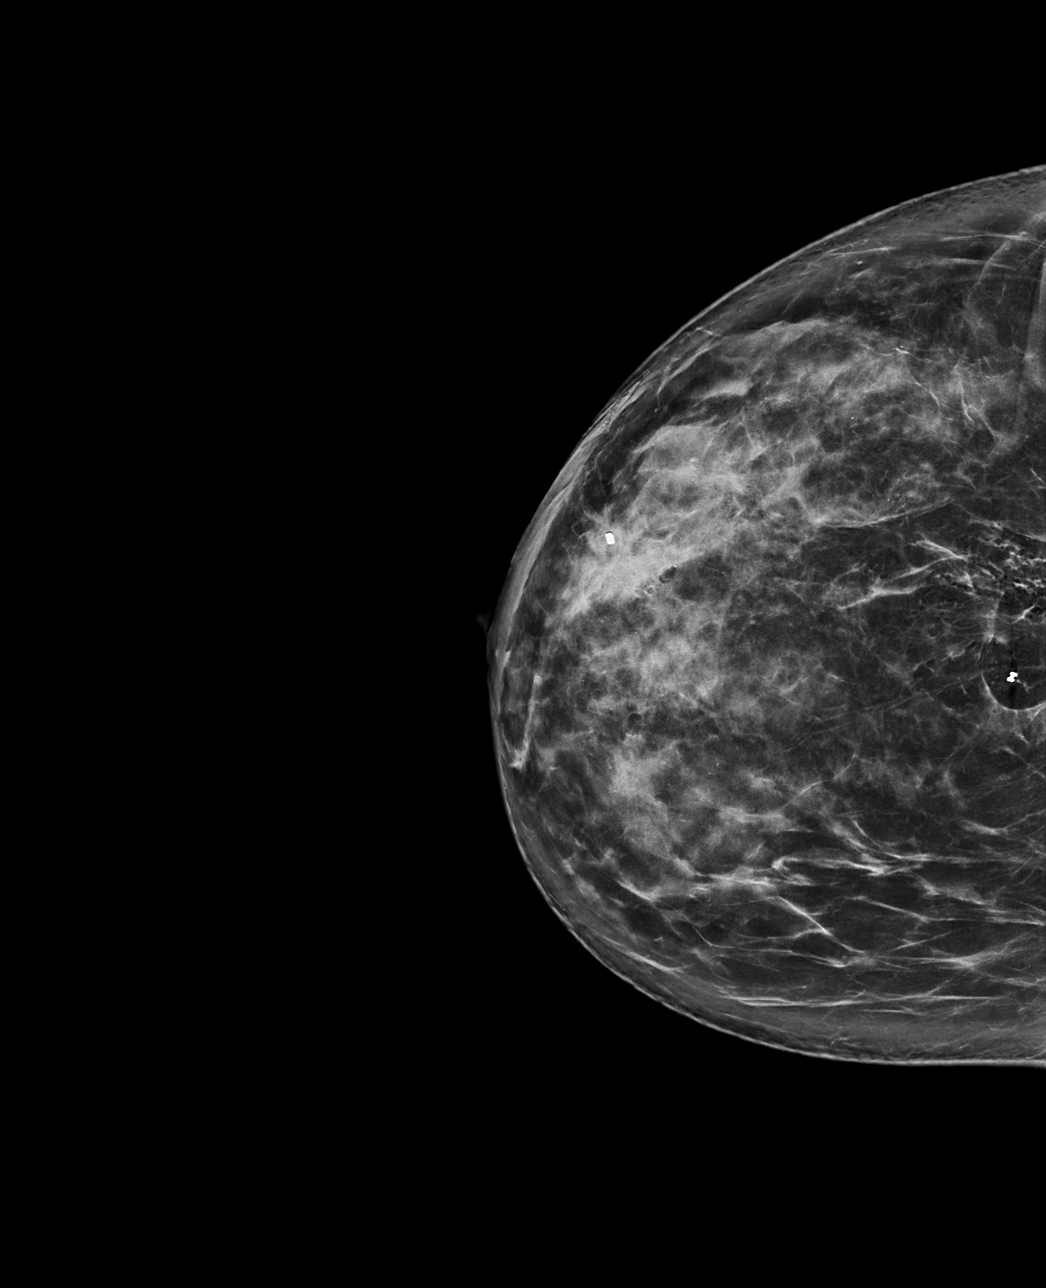

[R ML synth-2D]
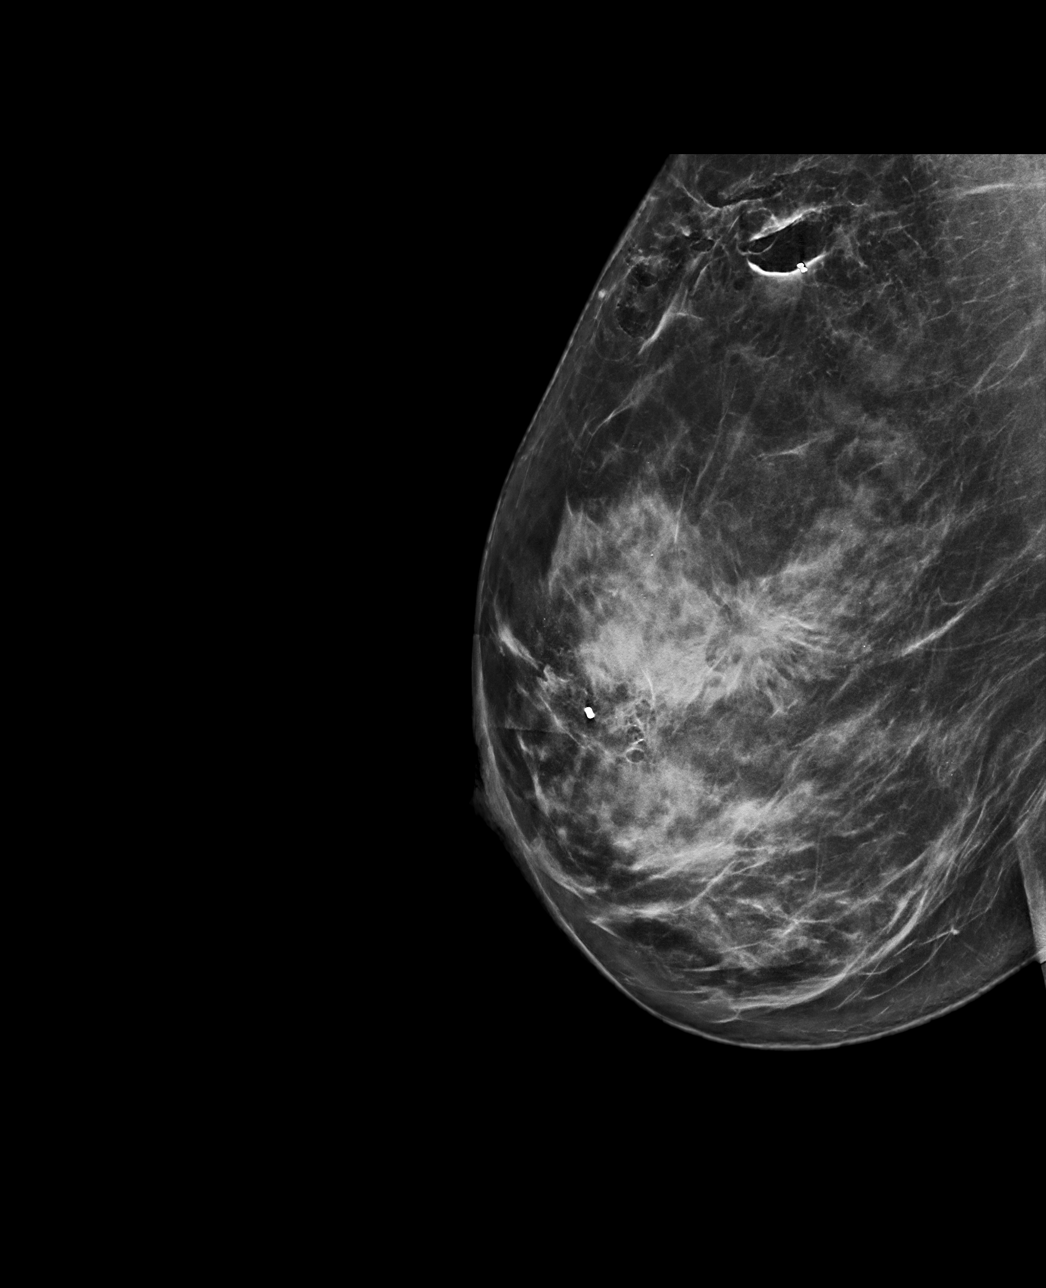

[R ML tomo · tomo slice 42/83.0]
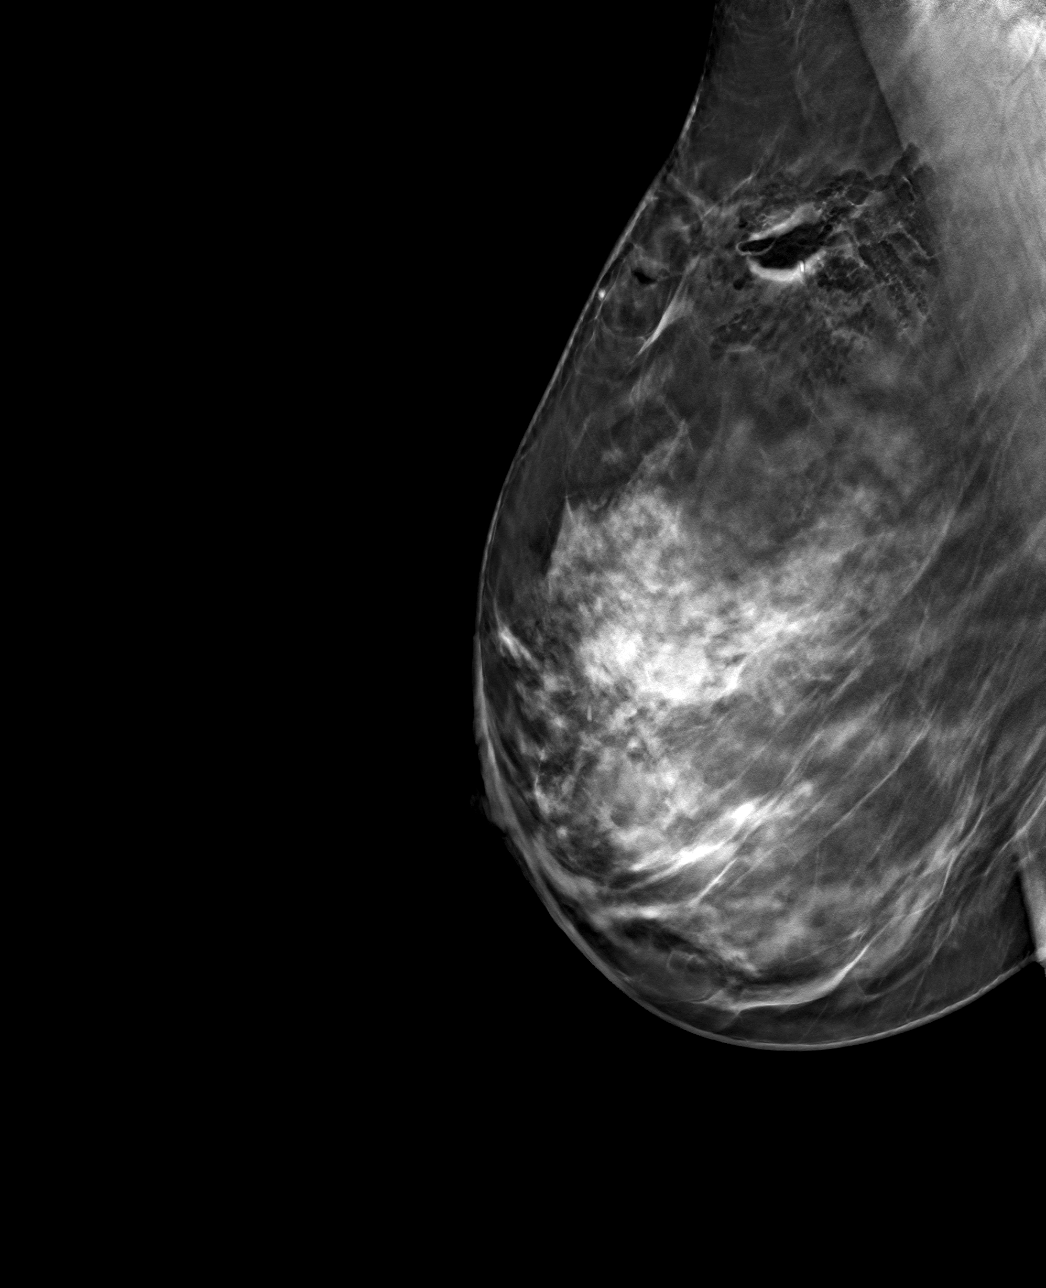

[R CC tomo · tomo slice 41/80.0]
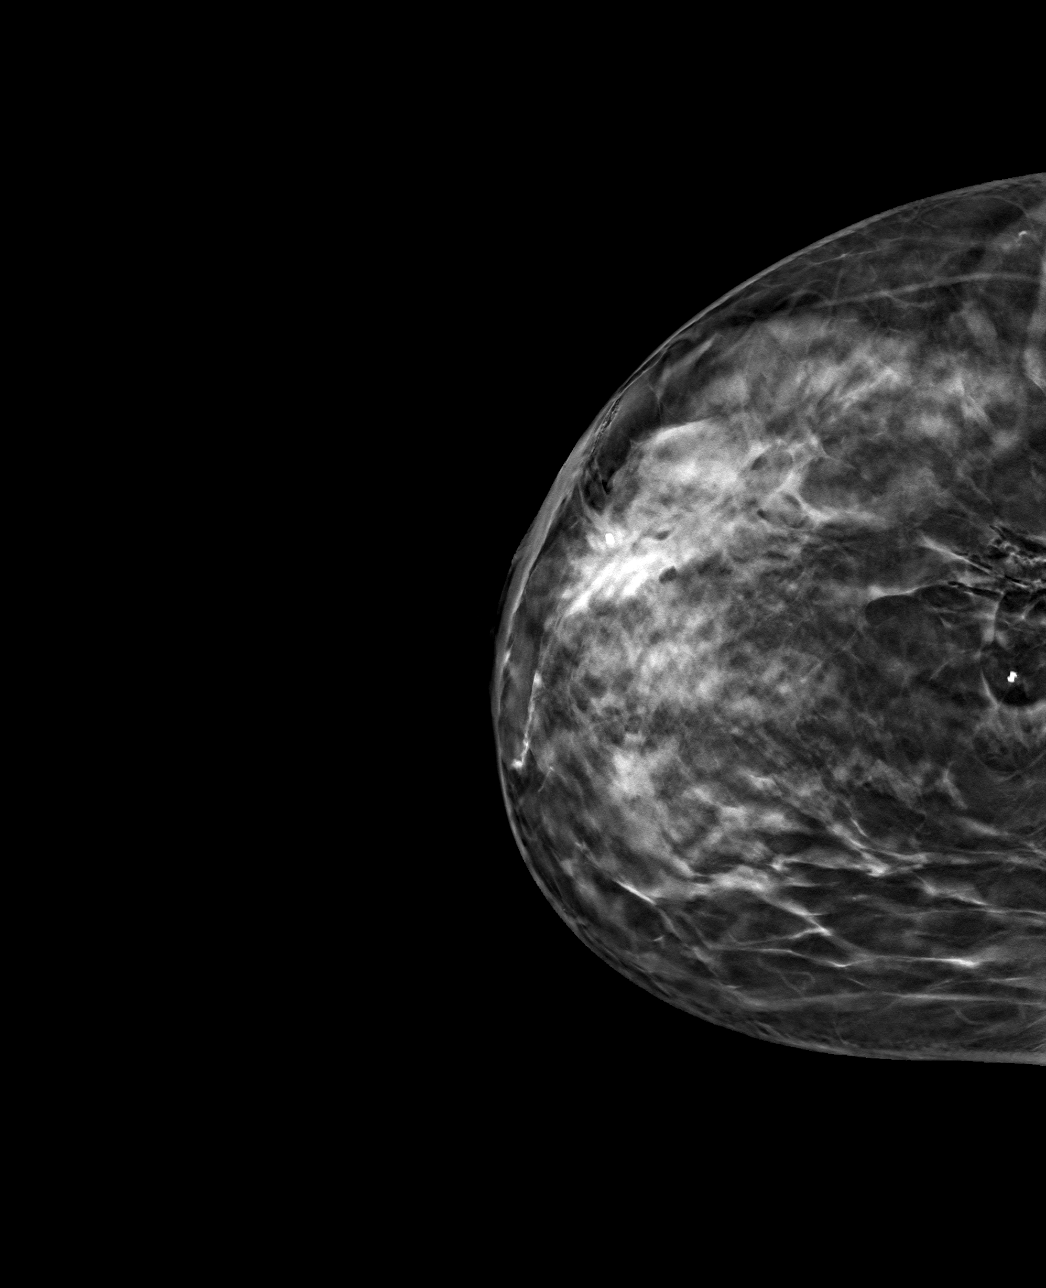

[4 of 12 positions shown; findings below may reference images not displayed]

FINDINGS: 2D and 3D full field CC and mediolateral images were obtained
following MRI guided biopsy of non-mass enhancement in the UPPER
INNER QUADRANT of the RIGHT breast at posterior depth, a mass in the
outer RIGHT breast at anterior depth, and a mass in the LOWER INNER
QUADRANT of the LEFT breast.

RIGHT: The barbell shaped tissue marking clip is appropriately
positioned at the site of the biopsied non-mass enhancement in the
UPPER INNER QUADRANT at posterior depth. The cylinder shaped tissue
marking clip is appropriately positioned at the site of the biopsied
mass in the UPPER INNER QUADRANT at anterior depth.

LEFT: The barbell shaped tissue marking clip is appropriately
positioned at the site of the biopsied mass in the LOWER INNER
QUADRANT at middle depth.

Expected post biopsy changes are present at all 3 sites without
evidence of hematoma.
IMPRESSION: 1. Appropriate positioning of the barbell shaped tissue marking clip
at the site of biopsy in the UPPER INNER QUADRANT of the RIGHT
breast at posterior depth.
2. Appropriate positioning of the cylinder shaped tissue marking
clip at the site of biopsy in the outer RIGHT breast at anterior
depth.
3. Appropriate positioning of the barbell shaped tissue marking clip
at the site of biopsy in the LOWER INNER QUADRANT of the LEFT breast
at middle depth.

Final Assessment: Post Procedure Mammograms for Marker Placement

## 2024-03-04 ENCOUNTER — Ambulatory Visit: Payer: 59 | Admitting: Obstetrics and Gynecology

## 2024-03-04 ENCOUNTER — Encounter: Payer: Self-pay | Admitting: Obstetrics and Gynecology

## 2024-03-04 VITALS — BP 110/72 | HR 98 | Ht 63.5 in | Wt 162.6 lb

## 2024-03-04 DIAGNOSIS — Z87440 Personal history of urinary (tract) infections: Secondary | ICD-10-CM

## 2024-03-04 DIAGNOSIS — K5904 Chronic idiopathic constipation: Secondary | ICD-10-CM | POA: Diagnosis not present

## 2024-03-04 DIAGNOSIS — R350 Frequency of micturition: Secondary | ICD-10-CM | POA: Diagnosis not present

## 2024-03-04 DIAGNOSIS — N390 Urinary tract infection, site not specified: Secondary | ICD-10-CM

## 2024-03-04 DIAGNOSIS — N3946 Mixed incontinence: Secondary | ICD-10-CM

## 2024-03-04 DIAGNOSIS — N816 Rectocele: Secondary | ICD-10-CM | POA: Diagnosis not present

## 2024-03-04 LAB — POCT URINALYSIS DIPSTICK
Bilirubin, UA: NEGATIVE
Blood, UA: NEGATIVE
Glucose, UA: NEGATIVE
Ketones, UA: NEGATIVE
Leukocytes, UA: NEGATIVE
Nitrite, UA: NEGATIVE
Protein, UA: NEGATIVE
Spec Grav, UA: 1.015 (ref 1.010–1.025)
Urobilinogen, UA: 0.2 U/dL
pH, UA: 5.5 (ref 5.0–8.0)

## 2024-03-04 MED ORDER — MIRABEGRON ER 25 MG PO TB24: 25.0000 mg | ORAL_TABLET | Freq: Every day | ORAL | 0 refills | Status: DC

## 2024-03-04 MED ORDER — MIRABEGRON ER 50 MG PO TB24: 50.0000 mg | ORAL_TABLET | Freq: Every day | ORAL | 2 refills | Status: AC

## 2024-03-04 NOTE — Patient Instructions (Addendum)
 Today we talked about ways to manage bladder urgency such as altering your diet to avoid irritative beverages and foods (bladder diet) as well as attempting to decrease stress and other exacerbating factors.  You can also chew a plain Tums 1-3 times per day to make your urine less acidic, especially if you have eating/drinking acidic things.    The Most Bothersome Foods* The Least Bothersome Foods*  Coffee - Regular & Decaf Tea - caffeinated Carbonated beverages - cola, non-colas, diet & caffeine-free Alcohols - Beer, Red Wine, White Wine, [address] - Grapefruit, [place], Orange, [employer] - Cranberry, Grapefruit, Orange, Pineapple Vegetables - Tomato & Tomato Products Flavor Enhancers - Hot peppers, Spicy foods, Chili, Horseradish, Vinegar, Monosodium glutamate (MSG) Artificial Sweeteners - NutraSweet, Sweet 'N Low, Equal (sweetener), Saccharin Ethnic foods - [place], [place], [place] food [employer] - low-fat & whole Fruits - Bananas, Blueberries, Honeydew melon, Pears, Raisins, Watermelon Vegetables - Broccoli, [address] Sprouts, [place], Carrots, Cauliflower, [place], Cucumber, Mushrooms, Peas, Radishes, Squash, Zucchini, White potatoes, Sweet potatoes & yams Poultry - Chicken, Eggs, [place], [employer] - Beef, Diplomatic Services operational officer, Lamb Seafood - Shrimp, [place] fish, Salmon Grains - Oat, Rice Snacks - Pretzels, Popcorn  *Lenward Chancellor et al. Diet and its role in interstitial cystitis/bladder pain syndrome (IC/BPS) and comorbid conditions. [employer]. BJU Int. 2012 Jan 11.    For your bowels please consider using miralax daily and using a squatty potty when able to have softer bowel movements and decrease the pressure on the back vaginal wall.     We are going to start with medication (Myrbetriq 25mg  daily then up to 50mg  daily) will follow up in 6 weeks and see how this is. By that time you will also have had your spine appointment.   For the recurrent UTI, you can use the estrogen  cream around the urethra for prevention and then try and let me know what the cultures showed so we can make a plan.   Posterior vaginal wall prolapse, just try to not strain to have bowel movements as much as you can. Squatty potty and miralax are my first suggestions.   For the leakage with cough and sneeze, consider options of urethral bulking vs. Surgical sling.

## 2024-03-04 NOTE — Progress Notes (Signed)
 [facility] New Patient Evaluation and Consultation  Referring Provider: No ref. provider found PCP: Pcp, No Date of Service: 03/04/2024  SUBJECTIVE Chief Complaint: New Patient (Initial Visit) Abigail Ware is a 53 y.o. female is here for recurrent UTI)  History of Present Illness: Abigail Ware is a 52 y.o. White or Caucasian female seen for recurrent UTI and and incontinence.  UUI> SUI Takes Doxycycline daily for 6 months for facial bacterial prophylaxis.   Review of records significant for: Patient recently had an MRI for her spine, need records No records of recent UTI cultures; patient reports she will call and try to get them for [place].   Urinary Symptoms: Leaks urine with cough/ sneeze, exercise, going from sitting to standing, during sex, with a full bladder, with movement to the bathroom, and with urgency Leaks 5 time(s) per days.  Pad use: Changes clothes   Patient is bothered by UI symptoms.  Day time voids 10.  Nocturia: 1 times per night to void. Voiding dysfunction:  does not empty bladder well.  Patient does not use a catheter to empty bladder.  When urinating, patient feels dribbling after finishing and the need to urinate multiple times in a row Drinks: Drinks: 20-24oz water, 8oz coffee, wine at night per day  UTIs: 3 UTI's in the last year.   Denies history of blood in urine, kidney or bladder stones, pyelonephritis, bladder cancer, and kidney cancer No results found for the last 90 days.   Pelvic Organ Prolapse Symptoms:                  Patient Denies a feeling of a bulge the vaginal area.  Bowel Symptom: Bowel movements: 1 time(s) per day Stool consistency: soft  Straining: yes.  Splinting: no.  Incomplete evacuation: yes.  Patient Denies accidental bowel leakage / fecal incontinence Bowel regimen: none Last colonoscopy: Never [facility] Colonoscopy          Current Care Gaps     Colonoscopy (Every 10 Years) Never done   No completion  history exists for this topic.                 Sexual Function Sexually active: yes.  Sexual orientation: Straight Pain with sex: Yes, deep in the pelvis  Pelvic Pain Denies pelvic pain   Past Medical History:  Past Medical History:  Diagnosis Date   Anemia    history   Seasonal allergies    SVD (spontaneous vaginal delivery)    x 2     Past Surgical History:   Past Surgical History:  Procedure Laterality Date   APPENDECTOMY     BREAST LUMPECTOMY  05/2021   DILITATION & CURRETTAGE/HYSTROSCOPY WITH NOVASURE ABLATION N/A 03/25/2017   Procedure: DILATATION & CURETTAGE/HYSTEROSCOPY WITH NOVASURE ABLATION;  Surgeon: Mitchel Honour, DO;  Location: [facility];  Service: Gynecology;  Laterality: N/A;   WISDOM TOOTH EXTRACTION       Past OB/GYN History: [id number] Vaginal deliveries: 2,  Forceps/ Vacuum deliveries: 0, Cesarean section: 0 Menopausal: No, LMP 02/08/24 Contraception: LoloEstrin. Last pap smear was April 2024.  Any history of abnormal pap smears: yes. [facility] PAP   This patient has no relevant Health Maintenance data.     Medications: Patient has a current medication list which includes the following prescription(s): b complex vitamins, fluconazole, ibuprofen, loestrin fe 1/20, mirabegron er, mirabegron er, multivitamin with minerals, phentermine, cyclobenzaprine, and estradiol.   Allergies: Patient is allergic to codeine, gadolinium, and sulfa antibiotics.  Social History:  Social History   Tobacco Use   Smoking status: Never   Smokeless tobacco: Never  Vaping Use   Vaping status: Never Used  Substance Use Topics   Alcohol use: Yes    Alcohol/week: 3.0 standard drinks of alcohol    Types: 3 Glasses of wine per week    Comment: wine    Drug use: No    Relationship status: married Patient lives with husband.   Patient is employed as a Sales executive. Regular exercise: No History of abuse: No  Family History:   Family History  Problem Relation  Age of Onset   Breast cancer Mother    Heart disease Father    Heart disease Paternal Aunt    COPD Neg Hx    Lupus Neg Hx      Review of Systems: Review of Systems  Constitutional:  Positive for malaise/fatigue. Negative for chills and fever.  Respiratory:  Negative for cough and shortness of breath.   Cardiovascular:  Negative for chest pain and palpitations.  Gastrointestinal:  Negative for abdominal pain, blood in stool, constipation and diarrhea.  Skin:  Negative for rash.  Neurological:  Negative for weakness.  Endo/Heme/Allergies:  Bruises/bleeds easily.  Psychiatric/Behavioral:  Negative for depression and suicidal ideas. The patient is nervous/anxious.      OBJECTIVE Physical Exam: Vitals:   03/04/24 1015  BP: 110/72  Pulse: 98  Weight: 162 lb 9.6 oz (73.8 kg)  Height: 5' 3.5" (1.613 m)    Physical Exam Constitutional:      Appearance: Normal appearance.  Pulmonary:     Effort: Pulmonary effort is normal.  Abdominal:     Palpations: Abdomen is soft.  Neurological:     General: No focal deficit present.     Mental Status: She is alert and oriented to person, place, and time.  Psychiatric:        Mood and Affect: Mood normal.        Behavior: Behavior normal. Behavior is cooperative.        Thought Content: Thought content normal.      GU / Detailed Urogynecologic Evaluation:  Pelvic Exam: Normal external female genitalia; Bartholin's and Skene's glands normal in appearance; urethral meatus normal in appearance, no urethral masses or discharge.   CST: negative  Speculum exam reveals normal vaginal mucosa without atrophy. Cervix normal appearance. Uterus normal single, nontender. Adnexa normal adnexa.    With apex supported, anterior compartment defect was reduced  Pelvic floor strength II/V  Pelvic floor musculature: Right levator non-tender, Right obturator non-tender, Left levator non-tender, Left obturator non-tender  POP-Q:   POP-Q  -1.5                                             Aa   -1.5                                           Ba  -5                                              C   4  Gh  4                                            Pb  8.5                                            tvl   -1                                            Ap  -1                                            Bp  -6                                              D      Rectal Exam:  Normal sphincter tone, small distal rectocele, enterocoele not present, no rectal masses, noted dyssynergia when asking the patient to bear down.  Post-Void Residual (PVR) by Bladder Scan: In order to evaluate bladder emptying, we discussed obtaining a postvoid residual and patient agreed to this procedure.  Procedure: The ultrasound unit was placed on the patient's abdomen in the suprapubic region after the patient had voided.      Laboratory Results: Lab Results  Component Value Date   COLORU Yellow 03/04/2024   CLARITYU Clear 03/04/2024   GLUCOSEUR Negative 03/04/2024   BILIRUBINUR Negative 03/04/2024   KETONESU Negative 03/04/2024   SPECGRAV 1.015 03/04/2024   RBCUR Negative 03/04/2024   PHUR 5.5 03/04/2024   PROTEINUR Negative 03/04/2024   UROBILINOGEN 0.2 03/04/2024   LEUKOCYTESUR Negative 03/04/2024    No results found for: "CREATININE"  No results found for: "HGBA1C"  Lab Results  Component Value Date   HGB 14.4 03/25/2017     ASSESSMENT AND PLAN Abigail Ware is a 53 y.o. with:  1. Urinary frequency   2. Mixed urge and stress incontinence   3. Chronic idiopathic constipation   4. Posterior vaginal wall prolapse   5. Recurrent UTI    We discussed the symptoms of overactive bladder (OAB), which include urinary urgency, urinary frequency, nocturia, with or without urge incontinence.  While we do not know the exact etiology of OAB, several treatment options exist. We discussed management  including behavioral therapy (decreasing bladder irritants, urge suppression strategies, timed voids, bladder retraining), physical therapy, medication; for refractory cases posterior tibial nerve stimulation, sacral neuromodulation, and intravesical botulinum toxin injection. For anticholinergic medications, we discussed the potential side effects of anticholinergics including dry eyes, dry mouth, constipation, cognitive impairment and urinary retention. For Beta-3 agonist medication, we discussed the potential side effect of elevated blood pressure which is more likely to occur in individuals with uncontrolled hypertension. Patient has significant constipation and is not a good candidate for anticholinergic medications due to the risk of dry eyes, dry mouth and further constipation. Will start patient on Myrbetriq 25mg . She is able to  check her blood pressure periodically at home. She will write these down for [place] to know and will monitor for side effects. Patient works full time and is unable to time commit for pelvic floor PT at this time. For her stress urinary incontinence, we discussed options of surgical sling, pessary, and urethral bulking. Information given on Urethral bulking as she seemed most interested in this. She would need a simple CMG or UDS to show leakage prior to procedure.  We discussed squatty potty use and consider doing Miralax. We discussed that decreasing constipation would be helpful for bladder symptoms and for the posterior vaginal wall prolapse.  Patient is asymptomatic at this time of the prolapse. Encouraged her to try and reduce constipation and straining as much as possible and if this becomes more bothersome we can address it at that time.  For patient's recurrent UTI symptoms we discussed it would be helpful for me to have the culture results to confirm UTI's vs. LUTS/IC. Patient reports she will try to get the culture results if the urines were cultured. She also already has  a prescription for vaginal estrogen that we discussed she can use around the urethra for support. We also discussed skin hygiene and using a gentle cleanser like cetaphil as is recommended often by infectious disease.   Patient to follow up in 6 weeks or sooner if needed.      Selmer Dominion, NP

## 2024-03-20 ENCOUNTER — Other Ambulatory Visit: Payer: Self-pay | Admitting: Obstetrics and Gynecology

## 2024-03-20 DIAGNOSIS — K5904 Chronic idiopathic constipation: Secondary | ICD-10-CM

## 2024-03-20 DIAGNOSIS — N3946 Mixed incontinence: Secondary | ICD-10-CM

## 2024-03-20 DIAGNOSIS — R350 Frequency of micturition: Secondary | ICD-10-CM

## 2024-03-21 ENCOUNTER — Ambulatory Visit

## 2024-03-21 ENCOUNTER — Telehealth: Payer: Self-pay

## 2024-03-21 NOTE — Telephone Encounter (Signed)
 Error

## 2024-03-28 ENCOUNTER — Other Ambulatory Visit: Payer: Self-pay | Admitting: Neurological Surgery

## 2024-03-28 DIAGNOSIS — M5416 Radiculopathy, lumbar region: Secondary | ICD-10-CM

## 2024-04-06 ENCOUNTER — Ambulatory Visit
Admission: RE | Admit: 2024-04-06 | Discharge: 2024-04-06 | Disposition: A | Discharge status: Home / Self Care | Source: Ambulatory Visit | Attending: Neurological Surgery | Admitting: Neurological Surgery

## 2024-04-06 DIAGNOSIS — M5416 Radiculopathy, lumbar region: Secondary | ICD-10-CM

## 2024-04-13 ENCOUNTER — Encounter: Payer: Self-pay | Admitting: Obstetrics and Gynecology

## 2024-04-15 ENCOUNTER — Ambulatory Visit: Admitting: Obstetrics and Gynecology

## 2024-04-15 VITALS — BP 99/67 | HR 105

## 2024-04-15 DIAGNOSIS — R350 Frequency of micturition: Secondary | ICD-10-CM | POA: Diagnosis not present

## 2024-04-15 DIAGNOSIS — N816 Rectocele: Secondary | ICD-10-CM

## 2024-04-15 NOTE — Progress Notes (Signed)
 [facility] Return Visit  SUBJECTIVE  History of Present Illness: Abigail Ware is a 53 y.o. female seen in follow-up for OAB/UUI. Plan at last visit was Start Myrbetriq  25mg  daily.    She reports significant improvement in her OAB and frequency symptoms. Denies accidents. States she is happy on the 25mg  and has not had an increase in blood pressure.   Patient has significant concerns about her back pain. In it across the lower buttocks and when she changes positions it sometimes locks up and if she stretched her legs her calf's have significant spasms and tightness. She has seen neurology who discussed with her that they do not think her symptoms are coming from her spine.   On an MRI they found she had an enlarged uterus and her OBGYN did an US  in office and confirmed there was also an extra-uterine fibroid that is 38mmx10mm.    Past Medical History: Patient  has a past medical history of Anemia, Seasonal allergies, and SVD (spontaneous vaginal delivery).   Past Surgical History: She  has a past surgical history that includes Appendectomy; Wisdom tooth extraction; Dilatation & currettage/hysteroscopy with novasure ablation (N/A, 03/25/2017); and Breast lumpectomy (05/2021).   Medications: She has a current medication list which includes the following prescription(s): b complex vitamins, cyclobenzaprine, estradiol, fluconazole , ibuprofen , loestrin fe 1/20, mirabegron  er, mirabegron  er, multivitamin with minerals, and phentermine.   Allergies: Patient is allergic to codeine, gadolinium, and sulfa antibiotics.   Social History: Patient  reports that she has never smoked. She has never used smokeless tobacco. She reports current alcohol use of about 3.0 standard drinks of alcohol per week. She reports that she does not use drugs.     OBJECTIVE     Physical Exam: Vitals:   04/15/24 1114  BP: 99/67  Pulse: (!) 105   Gen: No apparent distress, A&O x 3.  Detailed  Urogynecologic Evaluation:  Deferred.    ASSESSMENT AND PLAN    Abigail Ware is a 53 y.o. with:  1. Urinary frequency   2. Posterior vaginal wall prolapse    Patient is doing well on the Myrbetriq  25mg  daily for her OAB.  Patient has significant concerns on whether or not her uterus is contributory to her back pain. I called and left a message with the OBGYN office of Dr. Cipriano Creeks and they sent a report stating that her uterus was enlarged (16.63cm L) and that she did have a subserosal fibroid measuring 10cmx10cm. I cannot rule this out as a cause of her pain, but we also discussed that we cannot confirm this is causing the pain either. If she were to desire a hysterectomy then she could potentially have prolapse repair done at the same time, but she has significant concerns about changes that would occur in her sex life, as well as concerns of having a surgery that does not fix her pain.     Cavon Nicolls G Ainslie Mazurek, NP

## 2024-04-18 ENCOUNTER — Encounter: Payer: Self-pay | Admitting: Obstetrics and Gynecology
